# Patient Record
Sex: Female | Born: 1965 | Race: Black or African American | Hispanic: No | Marital: Married | Smoking: Never smoker
Health system: Southern US, Community
[De-identification: ages and names within clinical notes are randomized; demographics above are authoritative.]

## PROBLEM LIST (undated history)

## (undated) DIAGNOSIS — D649 Anemia, unspecified: Secondary | ICD-10-CM

## (undated) DIAGNOSIS — I10 Essential (primary) hypertension: Secondary | ICD-10-CM

## (undated) HISTORY — PX: BUNIONECTOMY: SHX129

## (undated) HISTORY — DX: Anemia, unspecified: D64.9

## (undated) HISTORY — PX: WISDOM TOOTH EXTRACTION: SHX21

## (undated) HISTORY — DX: Essential (primary) hypertension: I10

---

## 1996-10-18 HISTORY — PX: TUBAL LIGATION: SHX77

## 2008-10-18 HISTORY — PX: ENDOMETRIAL ABLATION: SHX621

## 2011-10-19 DIAGNOSIS — D649 Anemia, unspecified: Secondary | ICD-10-CM

## 2011-10-19 HISTORY — DX: Anemia, unspecified: D64.9

## 2014-03-01 ENCOUNTER — Ambulatory Visit (INDEPENDENT_AMBULATORY_CARE_PROVIDER_SITE_OTHER): Payer: 59 | Admitting: Gynecology

## 2014-03-01 ENCOUNTER — Other Ambulatory Visit: Payer: Self-pay

## 2014-03-01 ENCOUNTER — Other Ambulatory Visit: Payer: Self-pay | Admitting: Gynecology

## 2014-03-01 ENCOUNTER — Telehealth: Payer: Self-pay | Admitting: Gynecology

## 2014-03-01 ENCOUNTER — Encounter: Payer: Self-pay | Admitting: Gynecology

## 2014-03-01 VITALS — BP 132/96 | HR 98 | Resp 18 | Ht 62.5 in | Wt 161.0 lb

## 2014-03-01 DIAGNOSIS — Z01419 Encounter for gynecological examination (general) (routine) without abnormal findings: Secondary | ICD-10-CM

## 2014-03-01 DIAGNOSIS — D259 Leiomyoma of uterus, unspecified: Secondary | ICD-10-CM

## 2014-03-01 DIAGNOSIS — R21 Rash and other nonspecific skin eruption: Secondary | ICD-10-CM

## 2014-03-01 DIAGNOSIS — L988 Other specified disorders of the skin and subcutaneous tissue: Secondary | ICD-10-CM

## 2014-03-01 DIAGNOSIS — Z0000 Encounter for general adult medical examination without abnormal findings: Secondary | ICD-10-CM

## 2014-03-01 DIAGNOSIS — Z124 Encounter for screening for malignant neoplasm of cervix: Secondary | ICD-10-CM

## 2014-03-01 DIAGNOSIS — D219 Benign neoplasm of connective and other soft tissue, unspecified: Secondary | ICD-10-CM

## 2014-03-01 DIAGNOSIS — R238 Other skin changes: Secondary | ICD-10-CM

## 2014-03-01 DIAGNOSIS — Z1231 Encounter for screening mammogram for malignant neoplasm of breast: Secondary | ICD-10-CM

## 2014-03-01 LAB — POCT URINALYSIS DIPSTICK
Bilirubin, UA: NEGATIVE
Glucose, UA: NEGATIVE
KETONES UA: NEGATIVE
Leukocytes, UA: NEGATIVE
Nitrite, UA: NEGATIVE
PH UA: 6
PROTEIN UA: NEGATIVE
RBC UA: NEGATIVE
Urobilinogen, UA: NEGATIVE

## 2014-03-01 MED ORDER — NYSTATIN-TRIAMCINOLONE 100000-0.1 UNIT/GM-% EX OINT: 1.0000 "application " | TOPICAL_OINTMENT | Freq: Two times a day (BID) | CUTANEOUS | Status: DC

## 2014-03-01 MED ORDER — NYSTATIN 100000 UNIT/GM EX CREA: 1.0000 "application " | TOPICAL_CREAM | Freq: Two times a day (BID) | CUTANEOUS | Status: DC

## 2014-03-01 MED ORDER — TRIAMCINOLONE ACETONIDE 0.5 % EX OINT: 1.0000 "application " | TOPICAL_OINTMENT | Freq: Two times a day (BID) | CUTANEOUS | Status: DC

## 2014-03-01 NOTE — Telephone Encounter (Signed)
Patient saw Dr. Charlies Constable today and is calling to get a new RX because the nystatin/triamcinolone is not covered by her insurance. Please advise?

## 2014-03-01 NOTE — Telephone Encounter (Signed)
Left detailed message on patients voicemail at [phone]. Okay per release of information. Advised two new prescriptions sent over to pharmacy of choice. Patient will mix these two ointments together and apply two times a day. These two prescriptions should be covered by insurance and when mixed together they are the same as the original prescription. Advised to call back if she had any further questions.  Routing to provider for final review. Patient agreeable to disposition. Will close encounter

## 2014-03-01 NOTE — Addendum Note (Signed)
Addended by: Elroy Channel on: 03/01/2014 08:34 AM   Modules accepted: Orders

## 2014-03-01 NOTE — Patient Instructions (Signed)

## 2014-03-01 NOTE — Progress Notes (Signed)
48 y.o. Married [place] American female   [phone] here for annual exam. Pt is currently sexually active.  Pt has a history of vit D deficiency, was on supplementation in past.  Pt has had an ablation, she is no longer having cycles, but has has ovulatory symptoms, no hot flashes.  No abnormal PAP. No dyspareunia.    No LMP recorded. Patient is not currently having periods (Reason: Other).          Sexually active: yes  The current method of family planning is tubal ligation.    Exercising: yes  Walking Last pap: 01/2013 - Normal Alcohol: No Tobacco: No BSE: Yes, once a month MMG: 01/2013 - Normal    There are no preventive care reminders to display for this patient.  History reviewed. No pertinent family history.  There are no active problems to display for this patient.   Past Medical History  Diagnosis Date  . H/O vitamin D deficiency   . Abnormal uterine bleeding   . Anemia 2013  . High blood pressure   . Fibroid 2011    Past Surgical History  Procedure Laterality Date  . Tubal ligation  1998    Allergies: Review of patient's allergies indicates no known allergies.  No current outpatient prescriptions on file.   No current facility-administered medications for this visit.    ROS: Pertinent items are noted in HPI.  Exam:    BP 132/96  Pulse 98  Resp 18  Ht 5' 2.5" (1.588 m)  Wt 161 lb (73.029 kg)  BMI 28.96 kg/m2 Weight change: @WEIGHTCHANGE @ Last 3 height recordings:  Ht Readings from Last 3 Encounters:  03/01/14 5' 2.5" (1.588 m)   General appearance: alert, cooperative and appears stated age Head: Normocephalic, without obvious abnormality, atraumatic Neck: no adenopathy, no carotid bruit, no JVD, supple, symmetrical, trachea midline and thyroid not enlarged, symmetric, no tenderness/mass/nodules Lungs: clear to auscultation bilaterally Breasts: normal appearance, no masses or tenderness Heart: regular rate and rhythm, S1, S2 normal, no murmur, click,  rub or gallop Abdomen: soft, non-tender; bowel sounds normal; no masses,  no organomegaly Extremities: extremities normal, atraumatic, no cyanosis or edema Skin: Skin color, texture, turgor normal. No rashes or lesions, small rash between breasts Lymph nodes: Cervical, supraclavicular, and axillary nodes normal. no inguinal nodes palpated Neurologic: Grossly normal   Pelvic: External genitalia:  Shallow ulceration cluster posterior forchette with localized edema              Urethra: normal appearing urethra with no masses, tenderness or lesions              Bartholins and Skenes: Bartholin's, Urethra, Skene's normal                 Vagina: normal appearing vagina with normal color and discharge, no lesions              Cervix: normal appearance              Pap taken: yes        Bimanual Exam:  Uterus:  enlarged to 8 week's size, irregular                                      Adnexa:    no masses  Rectovaginal: Confirms                                      Anus:  normal sphincter tone, no lesions  A: well woman history of ablation Uterine fibroid Vesicular lesions Rash      P: mammogram pap smear with HRHPV, new guidelines reviewed Fibroids asymptomatic HSV titres mycolog counseled on breast self exam, mammography screening, adequate intake of calcium and vitamin D, diet and exercise return annually or prn   An After Visit Summary was printed and given to the patient.

## 2014-03-02 LAB — VITAMIN D 25 HYDROXY (VIT D DEFICIENCY, FRACTURES): Vit D, 25-Hydroxy: 28 ng/mL — ABNORMAL LOW (ref 30–89)

## 2014-03-04 ENCOUNTER — Other Ambulatory Visit: Payer: Self-pay | Admitting: *Deleted

## 2014-03-04 DIAGNOSIS — E559 Vitamin D deficiency, unspecified: Secondary | ICD-10-CM

## 2014-03-05 LAB — HSV(HERPES SMPLX)ABS-I+II(IGG+IGM)-BLD
HERPES SIMPLEX VRS I-IGM AB (EIA): 1.26 {index} — AB
HSV 1 Glycoprotein G Ab, IgG: 0.23 IV
HSV 2 GLYCOPROTEIN G AB, IGG: 6.72 IV — AB

## 2014-03-05 LAB — IPS PAP TEST WITH HPV

## 2014-03-07 MED ORDER — VALACYCLOVIR HCL 500 MG PO TABS: 500.0000 mg | ORAL_TABLET | Freq: Two times a day (BID) | ORAL | Status: DC

## 2014-03-07 NOTE — Progress Notes (Signed)
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  Pt reports having a blistered rash 10y ago that was tested by culture and to her recollection was negative.  She states that she has been married for 9y and her husband does not have herpes.  Reviewed the natural course of HSV infection and inaccuracy of culture.  We discussed that HSV can present as vaginitis around menses, she does not have a h/o recurrent vaginitis  As she has had very few outbreaks, i recommend she treat only as needed an rx was sent to pharmacy, questions addressed

## 2014-03-19 ENCOUNTER — Ambulatory Visit
Admission: RE | Admit: 2014-03-19 | Discharge: 2014-03-19 | Disposition: A | Discharge status: Home / Self Care | Payer: 59 | Source: Ambulatory Visit

## 2014-03-19 DIAGNOSIS — Z1231 Encounter for screening mammogram for malignant neoplasm of breast: Secondary | ICD-10-CM

## 2014-06-12 ENCOUNTER — Encounter: Payer: Self-pay | Admitting: Gynecology

## 2014-06-13 ENCOUNTER — Telehealth: Payer: Self-pay | Admitting: Gynecology

## 2014-06-13 NOTE — Telephone Encounter (Signed)
Patient canceled her appointment 06/18/14 for vitamin D labs. Patient says she will call later to reschedule.

## 2014-06-14 ENCOUNTER — Ambulatory Visit (INDEPENDENT_AMBULATORY_CARE_PROVIDER_SITE_OTHER): Payer: 59 | Admitting: Internal Medicine

## 2014-06-14 ENCOUNTER — Encounter: Payer: Self-pay | Admitting: Internal Medicine

## 2014-06-14 ENCOUNTER — Encounter (INDEPENDENT_AMBULATORY_CARE_PROVIDER_SITE_OTHER): Payer: Self-pay

## 2014-06-14 VITALS — Ht 62.25 in | Wt 161.0 lb

## 2014-06-14 DIAGNOSIS — Z91018 Allergy to other foods: Secondary | ICD-10-CM

## 2014-06-14 NOTE — Progress Notes (Signed)
Pre visit review using our clinic review tool, if applicable. No additional management support is needed unless otherwise documented below in the visit note. 

## 2014-06-14 NOTE — Progress Notes (Signed)
HPI  Pt presents to the clinic today to establish care. She reports that she does not really want to establish care but that she just wants to be allergy tested. She reports that she feels like she has food allergies. She can not pinpoint what it may be. She would like an order for an allergy panel to be drawn at [facility]. She prefers not to have skin testing done. She wants blood test. She can not tell me what specific symptoms she is having.  Was unable to ask any health maintenance information because patient was on the phone. I had to ask her twice to get off the phone so that we could discuss her health information.  Past Medical History  Diagnosis Date  . H/O vitamin D deficiency   . Abnormal uterine bleeding   . Anemia 2013  . High blood pressure   . Fibroid 2011    Current Outpatient Prescriptions  Medication Sig Dispense Refill  . nystatin cream (MYCOSTATIN) Apply 1 application topically 2 (two) times daily.  30 g  0  . triamcinolone ointment (KENALOG) 0.5 % Apply 1 application topically 2 (two) times daily.  30 g  0  . valACYclovir (VALTREX) 500 MG tablet Take 1 tablet (500 mg total) by mouth 2 (two) times daily. Take one tablet BID at onset of symptoms for 3 days.  12 tablet  1   No current facility-administered medications for this visit.    No Known Allergies  Family History  Problem Relation Age of Onset  . Cancer Neg Hx   . Diabetes Neg Hx   . Heart disease Neg Hx   . Stroke Neg Hx     History   Social History  . Marital Status: Married    Spouse Name: N/A    Number of Children: N/A  . Years of Education: N/A   Occupational History  . Not on file.   Social History Main Topics  . Smoking status: Never Smoker   . Smokeless tobacco: Never Used  . Alcohol Use: No  . Drug Use: No  . Sexual Activity: Yes    Birth Control/ Protection: Surgical   Other Topics Concern  . Not on file   Social History Narrative  . No narrative on file     ROS:  Constitutional: Denies fever, malaise, fatigue, headache or abrupt weight changes.  Respiratory: Denies difficulty breathing, shortness of breath, cough or sputum production.   Cardiovascular: Denies chest pain, chest tightness, palpitations or swelling in the hands or feet.  Gastrointestinal: Pt reports nausea. Denies abdominal pain, bloating, constipation, diarrhea or blood in the stool.  G Skin: Denies redness, rashes, lesions or ulcercations.    No other specific complaints in a complete review of systems (except as listed in HPI above).  PE:  Ht 5' 2.25" (1.581 m)  Wt 161 lb (73.029 kg)  BMI 29.22 kg/m2  LMP 03/18/2014 Wt Readings from Last 3 Encounters:  06/14/14 161 lb (73.029 kg)  03/01/14 161 lb (73.029 kg)    General: Appears her stated age, well developed, well nourished in NAD. Cardiovascular: Normal rate and rhythm. S1,S2 noted.  No murmur, rubs or gallops noted. No JVD or BLE edema. No carotid bruits noted. Pulmonary/Chest: Normal effort and positive vesicular breath sounds. No respiratory distress. No wheezes, rales or ronchi noted.    Assessment and Plan:  Concern of food allergies:  Will refer to Allergy for further testing Advised her I do not typically do allergy test  in the office She would not like to see me for anything else  RTC as needed

## 2014-06-14 NOTE — Patient Instructions (Signed)
Food Allergy A food allergy causes your body to have a strange reaction after you eat or drink certain foods or drinks. Allergic reactions can cause puffiness (swelling) and itchy, red rashes and hives. Sometimes you will throw up (vomit) or have watery poop (diarrhea). Severe allergic reactions can be life-threatening. These reactions can make it hard to breathe or swallow. HOME CARE If you do not know what caused your allergic reaction:  Write down the foods and drinks you had before the reaction.  Write down any problems you had.  Stop eating or drinking things that cause you to have a reaction. If you have hives or a rash:  Take medicine as told by your doctor.  Place cold cloths on your skin.  Take baths in cool water.  Do not take hot baths or showers. If you are severely allergic:  Wear a medical bracelet or necklace that lists your allergy.  Carry your allergy kit or medicine shot to treat severe allergic reactions with you. These can save your life.  Carry backup medicine shots. You can have a delayed reaction after the medicine from your first shot wears off. This can be just as serious as the first reaction.  Do not drive until medicine from your shot has worn off, unless your doctor says it is okay. GET HELP RIGHT AWAY IF:   You have trouble breathing or you are wheezing.  You have a tight feeling in your chest or throat.  You have puffiness around your mouth.  You have hives, puffiness, or itching all over your body.  You think you are having an allergic reaction. Problems usually start within 30 minutes after eating a food you are allergic to.  Your problems are not better after 2 days.  You have new problems.  Your problems come back. MAKE SURE YOU:   Understand these instructions.  Will watch your condition.  Will get help right away if you are not doing well or get worse. Document Released: 03/24/2010 Document Revised: 12/27/2011 Document Reviewed:  03/24/2010 [facility] Patient Information 2015 [place], [place]. This information is not intended to replace advice given to you by your health care provider. Make sure you discuss any questions you have with your health care provider.

## 2014-06-18 ENCOUNTER — Other Ambulatory Visit: Payer: Self-pay

## 2014-06-20 ENCOUNTER — Ambulatory Visit (INDEPENDENT_AMBULATORY_CARE_PROVIDER_SITE_OTHER): Payer: 59 | Admitting: Internal Medicine

## 2014-06-20 ENCOUNTER — Encounter: Payer: Self-pay | Admitting: Internal Medicine

## 2014-06-20 VITALS — BP 132/90 | HR 55 | Temp 98.4°F | Ht 63.0 in | Wt 160.5 lb

## 2014-06-20 DIAGNOSIS — R030 Elevated blood-pressure reading, without diagnosis of hypertension: Secondary | ICD-10-CM

## 2014-06-20 NOTE — Progress Notes (Signed)
Pre visit review using our clinic review tool, if applicable. No additional management support is needed unless otherwise documented below in the visit note. 

## 2014-06-20 NOTE — Patient Instructions (Addendum)

## 2014-06-21 ENCOUNTER — Encounter: Payer: Self-pay | Admitting: Internal Medicine

## 2014-06-21 DIAGNOSIS — R030 Elevated blood-pressure reading, without diagnosis of hypertension: Secondary | ICD-10-CM | POA: Insufficient documentation

## 2014-06-21 LAB — COMPREHENSIVE METABOLIC PANEL
ALBUMIN: 3.9 g/dL (ref 3.5–5.2)
ALT: 22 U/L (ref 0–35)
AST: 25 U/L (ref 0–37)
Alkaline Phosphatase: 70 U/L (ref 39–117)
BUN: 14 mg/dL (ref 6–23)
CALCIUM: 8.9 mg/dL (ref 8.4–10.5)
CHLORIDE: 106 meq/L (ref 96–112)
CO2: 28 mEq/L (ref 19–32)
Creatinine, Ser: 0.7 mg/dL (ref 0.4–1.2)
GFR: 112.65 mL/min (ref >60.00)
GLUCOSE: 79 mg/dL (ref 70–99)
POTASSIUM: 4.6 meq/L (ref 3.5–5.1)
Sodium: 139 mEq/L (ref 135–145)
Total Bilirubin: 0.9 mg/dL (ref 0.2–1.2)
Total Protein: 7.1 g/dL (ref 6.0–8.3)

## 2014-06-21 LAB — CBC
HCT: 37.3 % (ref 36.0–46.0)
Hemoglobin: 11.7 g/dL — ABNORMAL LOW (ref 12.0–15.0)
MCHC: 31.3 g/dL (ref 30.0–36.0)
MCV: 76.5 fl — ABNORMAL LOW (ref 78.0–100.0)
PLATELETS: 186 10*3/uL (ref 150.0–400.0)
RBC: 4.88 Mil/uL (ref 3.87–5.11)
RDW: 15 % (ref 11.5–15.5)
WBC: 7.1 10*3/uL (ref 4.0–10.5)

## 2014-06-21 NOTE — Assessment & Plan Note (Signed)
Advised her to work on diet and exercise Consume a low sodium diet She declines low dose medication at this time  I would like her to return in 1 month to recheck BP

## 2014-06-21 NOTE — Progress Notes (Signed)
Subjective:    Patient ID: Angel Black, female    DOB: 01-04-1966, 48 y.o.   MRN: [id number]  HPI  Pt presents to the clinic today with c/o headache and dizziness. She reports that she thought it may have been from food allergies. She was referred to an allergist. The allergist told her that it was not an allergy problem but that her blood pressure was elevated. Her BP at the allergist was 140/100. She was advised to followup with her PCP. Today, her BP is 130/90. She did have some headache and dizziness this morning but it has since subsided. She has been told in the past that she elevated blood pressure. At one time she was prescribed medication but reports that she never did take it.  Review of Systems      Past Medical History  Diagnosis Date  . H/O vitamin D deficiency   . Abnormal uterine bleeding   . Anemia 2013  . High blood pressure   . Fibroid 2011    Current Outpatient Prescriptions  Medication Sig Dispense Refill  . nystatin cream (MYCOSTATIN) Apply 1 application topically 2 (two) times daily.  30 g  0  . triamcinolone ointment (KENALOG) 0.5 % Apply 1 application topically 2 (two) times daily.  30 g  0  . valACYclovir (VALTREX) 500 MG tablet Take 1 tablet (500 mg total) by mouth 2 (two) times daily. Take one tablet BID at onset of symptoms for 3 days.  12 tablet  1   No current facility-administered medications for this visit.    No Known Allergies  Family History  Problem Relation Age of Onset  . Cancer Neg Hx   . Diabetes Neg Hx   . Heart disease Neg Hx   . Stroke Neg Hx     History   Social History  . Marital Status: Married    Spouse Name: N/A    Number of Children: N/A  . Years of Education: N/A   Occupational History  . Not on file.   Social History Main Topics  . Smoking status: Never Smoker   . Smokeless tobacco: Never Used  . Alcohol Use: No  . Drug Use: No  . Sexual Activity: Yes    Birth Control/ Protection: Surgical   Other Topics  Concern  . Not on file   Social History Narrative  . No narrative on file     Constitutional: Pt reports headache and dizziness. Denies fever, malaise, fatigue or abrupt weight changes.  Respiratory: Denies difficulty breathing, shortness of breath, cough or sputum production.   Cardiovascular: Denies chest pain, chest tightness, palpitations or swelling in the hands or feet.  Neurological: Denies difficulty with memory, difficulty with speech or problems with balance and coordination.   No other specific complaints in a complete review of systems (except as listed in HPI above).  Objective:   Physical Exam  BP 132/90  Pulse 55  Temp(Src) 98.4 F (36.9 C) (Oral)  Ht 5\' 3"  (1.6 m)  Wt 160 lb 8 oz (72.802 kg)  BMI 28.44 kg/m2  SpO2 98%  LMP 03/18/2014 Wt Readings from Last 3 Encounters:  06/20/14 160 lb 8 oz (72.802 kg)  06/14/14 161 lb (73.029 kg)  03/01/14 161 lb (73.029 kg)    General: Appears her stated age, well developed, well nourished in NAD. Cardiovascular: Normal rate and rhythm. S1,S2 noted.  No murmur, rubs or gallops noted. No JVD or BLE edema. No carotid bruits noted. Pulmonary/Chest: Normal effort and  positive vesicular breath sounds. No respiratory distress. No wheezes, rales or ronchi noted.  Neurological: Alert and oriented. Cranial nerves II-XII intact.   BMET    Component Value Date/Time   NA 139 06/20/2014 1617   K 4.6 06/20/2014 1617   CL 106 06/20/2014 1617   CO2 28 06/20/2014 1617   GLUCOSE 79 06/20/2014 1617   BUN 14 06/20/2014 1617   CREATININE 0.7 06/20/2014 1617   CALCIUM 8.9 06/20/2014 1617    Lipid Panel  No results found for this basename: chol, trig, hdl, cholhdl, vldl, ldlcalc    CBC    Component Value Date/Time   WBC 7.1 06/20/2014 1617   RBC 4.88 06/20/2014 1617   HGB 11.7* 06/20/2014 1617   HCT 37.3 06/20/2014 1617   PLT 186.0 06/20/2014 1617   MCV 76.5* 06/20/2014 1617   MCHC 31.3 06/20/2014 1617   RDW 15.0 06/20/2014 1617    Hgb A1C No results  found for this basename: HGBA1C         Assessment & Plan:

## 2014-07-25 ENCOUNTER — Ambulatory Visit: Payer: 59 | Admitting: Internal Medicine

## 2014-08-19 ENCOUNTER — Encounter: Payer: Self-pay | Admitting: Internal Medicine

## 2014-09-17 ENCOUNTER — Telehealth: Payer: Self-pay | Admitting: Gynecology

## 2014-09-17 NOTE — Telephone Encounter (Signed)
Left message regarding upcoming appointment has been canceled and needs to be rescheduled. °

## 2014-09-18 ENCOUNTER — Ambulatory Visit (INDEPENDENT_AMBULATORY_CARE_PROVIDER_SITE_OTHER): Payer: 59 | Admitting: Internal Medicine

## 2014-09-18 ENCOUNTER — Encounter: Payer: Self-pay | Admitting: Internal Medicine

## 2014-09-18 VITALS — BP 130/82 | HR 64 | Temp 98.1°F | Wt 163.0 lb

## 2014-09-18 DIAGNOSIS — R030 Elevated blood-pressure reading, without diagnosis of hypertension: Secondary | ICD-10-CM

## 2014-09-18 DIAGNOSIS — B9789 Other viral agents as the cause of diseases classified elsewhere: Principal | ICD-10-CM

## 2014-09-18 DIAGNOSIS — J069 Acute upper respiratory infection, unspecified: Secondary | ICD-10-CM

## 2014-09-18 MED ORDER — BENZONATATE 200 MG PO CAPS: 200.0000 mg | ORAL_CAPSULE | Freq: Two times a day (BID) | ORAL | Status: DC | PRN

## 2014-09-18 NOTE — Patient Instructions (Signed)
Upper Respiratory Infection, Adult An upper respiratory infection (URI) is also sometimes known as the common cold. The upper respiratory tract includes the nose, sinuses, throat, trachea, and bronchi. Bronchi are the airways leading to the lungs. Most people improve within 1 week, but symptoms can last up to 2 weeks. A residual cough may last even longer.  CAUSES Many different viruses can infect the tissues lining the upper respiratory tract. The tissues become irritated and inflamed and often become very moist. Mucus production is also common. A cold is contagious. You can easily spread the virus to others by oral contact. This includes kissing, sharing a glass, coughing, or sneezing. Touching your mouth or nose and then touching a surface, which is then touched by another person, can also spread the virus. SYMPTOMS  Symptoms typically develop 1 to 3 days after you come in contact with a cold virus. Symptoms vary from person to person. They may include:  Runny nose.  Sneezing.  Nasal congestion.  Sinus irritation.  Sore throat.  Loss of voice (laryngitis).  Cough.  Fatigue.  Muscle aches.  Loss of appetite.  Headache.  Low-grade fever. DIAGNOSIS  You might diagnose your own cold based on familiar symptoms, since most people get a cold 2 to 3 times a year. Your caregiver can confirm this based on your exam. Most importantly, your caregiver can check that your symptoms are not due to another disease such as strep throat, sinusitis, pneumonia, asthma, or epiglottitis. Blood tests, throat tests, and X-rays are not necessary to diagnose a common cold, but they may sometimes be helpful in excluding other more serious diseases. Your caregiver will decide if any further tests are required. RISKS AND COMPLICATIONS  You may be at risk for a more severe case of the common cold if you smoke cigarettes, have chronic heart disease (such as heart failure) or lung disease (such as asthma), or if  you have a weakened immune system. The very young and very old are also at risk for more serious infections. Bacterial sinusitis, middle ear infections, and bacterial pneumonia can complicate the common cold. The common cold can worsen asthma and chronic obstructive pulmonary disease (COPD). Sometimes, these complications can require emergency medical care and may be life-threatening. PREVENTION  The best way to protect against getting a cold is to practice good hygiene. Avoid oral or hand contact with people with cold symptoms. Wash your hands often if contact occurs. There is no clear evidence that vitamin C, vitamin E, echinacea, or exercise reduces the chance of developing a cold. However, it is always recommended to get plenty of rest and practice good nutrition. TREATMENT  Treatment is directed at relieving symptoms. There is no cure. Antibiotics are not effective, because the infection is caused by a virus, not by bacteria. Treatment may include:  Increased fluid intake. Sports drinks offer valuable electrolytes, sugars, and fluids.  Breathing heated mist or steam (vaporizer or shower).  Eating chicken soup or other clear broths, and maintaining good nutrition.  Getting plenty of rest.  Using gargles or lozenges for comfort.  Controlling fevers with ibuprofen or acetaminophen as directed by your caregiver.  Increasing usage of your inhaler if you have asthma. Zinc gel and zinc lozenges, taken in the first 24 hours of the common cold, can shorten the duration and lessen the severity of symptoms. Pain medicines may help with fever, muscle aches, and throat pain. A variety of non-prescription medicines are available to treat congestion and runny nose. Your caregiver   can make recommendations and may suggest nasal or lung inhalers for other symptoms.  HOME CARE INSTRUCTIONS   Only take over-the-counter or prescription medicines for pain, discomfort, or fever as directed by your  caregiver.  Use a warm mist humidifier or inhale steam from a shower to increase air moisture. This may keep secretions moist and make it easier to breathe.  Drink enough water and fluids to keep your urine clear or pale yellow.  Rest as needed.  Return to work when your temperature has returned to normal or as your caregiver advises. You may need to stay home longer to avoid infecting others. You can also use a face mask and careful hand washing to prevent spread of the virus. SEEK MEDICAL CARE IF:   After the first few days, you feel you are getting worse rather than better.  You need your caregiver's advice about medicines to control symptoms.  You develop chills, worsening shortness of breath, or brown or red sputum. These may be signs of pneumonia.  You develop yellow or brown nasal discharge or pain in the face, especially when you bend forward. These may be signs of sinusitis.  You develop a fever, swollen neck glands, pain with swallowing, or white areas in the back of your throat. These may be signs of strep throat. SEEK IMMEDIATE MEDICAL CARE IF:   You have a fever.  You develop severe or persistent headache, ear pain, sinus pain, or chest pain.  You develop wheezing, a prolonged cough, cough up blood, or have a change in your usual mucus (if you have chronic lung disease).  You develop sore muscles or a stiff neck. Document Released: 03/30/2001 Document Revised: 12/27/2011 Document Reviewed: 01/09/2014 [employer] Patient Information 2015 [employer]. This information is not intended to replace advice given to you by your health care provider. Make sure you discuss any questions you have with your health care provider.  

## 2014-09-18 NOTE — Progress Notes (Signed)
Pre visit review using our clinic review tool, if applicable. No additional management support is needed unless otherwise documented below in the visit note. 

## 2014-09-18 NOTE — Assessment & Plan Note (Signed)
BP good today No need for medication at this  time

## 2014-09-18 NOTE — Progress Notes (Signed)
Subjective:    Patient ID: Angel Black, female    DOB: [birth year], 48 y.o.   MRN: [id number]  HPI  Pt presents to the clinic today with c/o cough and shortness of breath. She reports this started yesterday. The cough is non productive. She has had some associated runny nose but she denies fever, chills or body aches. She has not tried anything OTC. She has no history of allergies or breathing problems. She has not tried anything OTC.  Additionally, she is also here to have her blood pressure rechecked. She has had 2 borderline high blood pressure readings-  132/90, 132/96 within the last 6 months. We had discussed putting her on medication for her blood pressure but she has been refusing medication up to this point. BP today is 130/82.  Review of Systems      Past Medical History  Diagnosis Date  . H/O vitamin D deficiency   . Abnormal uterine bleeding   . Anemia 2013  . High blood pressure   . Fibroid 2011    No current outpatient prescriptions on file.   No current facility-administered medications for this visit.    No Known Allergies  Family History  Problem Relation Age of Onset  . Cancer Neg Hx   . Diabetes Neg Hx   . Heart disease Neg Hx   . Stroke Neg Hx     History   Social History  . Marital Status: Married    Spouse Name: N/A    Number of Children: N/A  . Years of Education: N/A   Occupational History  . Not on file.   Social History Main Topics  . Smoking status: Never Smoker   . Smokeless tobacco: Never Used  . Alcohol Use: No  . Drug Use: No  . Sexual Activity: Yes    Birth Control/ Protection: Surgical   Other Topics Concern  . Not on file   Social History Narrative     Constitutional: Denies fever, malaise, fatigue, headache or abrupt weight changes.  HEENT: Pt reports runny nose. Denies eye pain, eye redness, ear pain, ringing in the ears, wax buildup, nasal congestion, bloody nose, or sore throat. Respiratory: Pt reports cough and  shortness of breath. Denies difficulty breathing or sputum production.   Cardiovascular: Denies chest pain, chest tightness, palpitations or swelling in the hands or feet.   No other specific complaints in a complete review of systems (except as listed in HPI above).  Objective:   Physical Exam   BP 130/82 mmHg  Pulse 64  Temp(Src) 98.1 F (36.7 C) (Oral)  Wt 163 lb (73.936 kg)  SpO2 99% Wt Readings from Last 3 Encounters:  09/18/14 163 lb (73.936 kg)  06/20/14 160 lb 8 oz (72.802 kg)  06/14/14 161 lb (73.029 kg)    General: Appears her stated age, well developed, well nourished in NAD. HEENT: Head: normal shape and size; Ears: Tm's gray and intact, normal light reflex; Nose: mucosa pink and moist, septum midline; Throat/Mouth: Teeth present, mucosa pink and moist, no exudate, lesions or ulcerations noted. Cervical adenopathy noted.  Cardiovascular: Normal rate and rhythm. S1,S2 noted.  No murmur, rubs or gallops noted.  Pulmonary/Chest: Normal effort and positive vesicular breath sounds. No respiratory distress. No wheezes, rales or ronchi noted.   BMET    Component Value Date/Time   NA 139 06/20/2014 1617   K 4.6 06/20/2014 1617   CL 106 06/20/2014 1617   CO2 28 06/20/2014 1617   GLUCOSE 79  06/20/2014 1617   BUN 14 06/20/2014 1617   CREATININE 0.7 06/20/2014 1617   CALCIUM 8.9 06/20/2014 1617    Lipid Panel  No results found for: CHOL, TRIG, HDL, CHOLHDL, VLDL, LDLCALC  CBC    Component Value Date/Time   WBC 7.1 06/20/2014 1617   RBC 4.88 06/20/2014 1617   HGB 11.7* 06/20/2014 1617   HCT 37.3 06/20/2014 1617   PLT 186.0 06/20/2014 1617   MCV 76.5* 06/20/2014 1617   MCHC 31.3 06/20/2014 1617   RDW 15.0 06/20/2014 1617    Hgb A1C No results found for: HGBA1C      Assessment & Plan:   Viral URI with cough:  Try some Ibuprofen OTC Get some rest and drink plenty of fluids eRx for Benzonate TID prn Watch for fever, chills, or colored mucous  RTC as  needed or if symptoms persist or worsen

## 2015-01-29 ENCOUNTER — Telehealth: Payer: Self-pay | Admitting: Certified Nurse Midwife

## 2015-01-29 NOTE — Telephone Encounter (Signed)
Pt requests date of last pap.

## 2015-01-29 NOTE — Telephone Encounter (Signed)
Return call to patient. She is calling to confirm date of last annual exam with Dr Charlies Constable. Advised last seen for annual 03-01-14 and scheduled for 03-20-15 with Debbi Hollice Espy.    Routing to provider for final review. Patient agreeable to disposition. Will close encounter.

## 2015-01-31 ENCOUNTER — Ambulatory Visit (INDEPENDENT_AMBULATORY_CARE_PROVIDER_SITE_OTHER): Payer: 59 | Admitting: Internal Medicine

## 2015-01-31 ENCOUNTER — Encounter: Payer: Self-pay | Admitting: Internal Medicine

## 2015-01-31 VITALS — BP 122/86 | HR 66 | Temp 98.1°F | Wt 160.0 lb

## 2015-01-31 DIAGNOSIS — N898 Other specified noninflammatory disorders of vagina: Secondary | ICD-10-CM | POA: Diagnosis not present

## 2015-01-31 DIAGNOSIS — R102 Pelvic and perineal pain: Secondary | ICD-10-CM

## 2015-01-31 DIAGNOSIS — D259 Leiomyoma of uterus, unspecified: Secondary | ICD-10-CM

## 2015-01-31 DIAGNOSIS — N939 Abnormal uterine and vaginal bleeding, unspecified: Secondary | ICD-10-CM

## 2015-01-31 LAB — POCT URINALYSIS DIPSTICK
Bilirubin, UA: NEGATIVE
Blood, UA: NEGATIVE
GLUCOSE UA: NEGATIVE
KETONES UA: NEGATIVE
LEUKOCYTES UA: NEGATIVE
Nitrite, UA: NEGATIVE
Spec Grav, UA: 1.025
UROBILINOGEN UA: NEGATIVE
pH, UA: 7

## 2015-01-31 NOTE — Progress Notes (Signed)
Subjective:    Patient ID: Angel Black, female    DOB: Feb 08, 1966, 49 y.o.   MRN: [id number]  HPI  Pt presents to the clinic today with c/o pelvic pain. This has been going on for 1-2 months. She reports it feels crampy and burns. She has had some vaginal spotting, which almost last as long as a period. She denies nausea, vomiting, constipation or blood in her stool. Her bowel movements are normal. She is very concerned about this vaginal bleeding because she had assumed she had gone through menopause because she did not have a period after her ablation. Her last pap smear was 02/2014. She has an appt with gyn in June.  Review of Systems  Past Medical History  Diagnosis Date  . H/O vitamin D deficiency   . Abnormal uterine bleeding   . Anemia 2013  . High blood pressure   . Fibroid 2011    Current Outpatient Prescriptions  Medication Sig Dispense Refill  . benzonatate (TESSALON) 200 MG capsule Take 1 capsule (200 mg total) by mouth 2 (two) times daily as needed for cough. 20 capsule 0   No current facility-administered medications for this visit.    No Known Allergies  Family History  Problem Relation Age of Onset  . Cancer Neg Hx   . Diabetes Neg Hx   . Heart disease Neg Hx   . Stroke Neg Hx     History   Social History  . Marital Status: Married    Spouse Name: N/A  . Number of Children: N/A  . Years of Education: N/A   Occupational History  . Not on file.   Social History Main Topics  . Smoking status: Never Smoker   . Smokeless tobacco: Never Used  . Alcohol Use: No  . Drug Use: No  . Sexual Activity: Yes    Birth Control/ Protection: Surgical   Other Topics Concern  . Not on file   Social History Narrative     Constitutional: Denies fever, malaise, fatigue, headache or abrupt weight changes.  Respiratory: Denies difficulty breathing, shortness of breath, cough or sputum production.   Cardiovascular: Denies chest pain, chest tightness, palpitations  or swelling in the hands or feet.  Gastrointestinal: Pt reports abdominal pain. Denies bloating, constipation, diarrhea or blood in the stool.  GU: Denies urgency, frequency, pain with urination, burning sensation, blood in urine, odor or discharge.   No other specific complaints in a complete review of systems (except as listed in HPI above).     Objective:   Physical Exam   BP 122/86 mmHg  Pulse 66  Temp(Src) 98.1 F (36.7 C) (Oral)  Wt 160 lb (72.576 kg)  SpO2 99% Wt Readings from Last 3 Encounters:  01/31/15 160 lb (72.576 kg)  09/18/14 163 lb (73.936 kg)  06/20/14 160 lb 8 oz (72.802 kg)    General: Appears her stated age, well developed, well nourished in NAD. Skin: Warm, dry and intact. Cardiovascular: Normal rate and rhythm. S1,S2 noted.  No murmur, rubs or gallops noted.  Pulmonary/Chest: Normal effort and positive vesicular breath sounds. No respiratory distress. No wheezes, rales or ronchi noted.  Abdomen: Soft and tender in the lower abdominal region. Normal bowel sounds, no bruits noted. No distention or masses noted.  Pelvic: Normal female anatomy. No CMT noted. Adnexa non palpable. No discharge noted.  BMET    Component Value Date/Time   NA 139 06/20/2014 1617   K 4.6 06/20/2014 1617   CL 106  06/20/2014 1617   CO2 28 06/20/2014 1617   GLUCOSE 79 06/20/2014 1617   BUN 14 06/20/2014 1617   CREATININE 0.7 06/20/2014 1617   CALCIUM 8.9 06/20/2014 1617    Lipid Panel  No results found for: CHOL, TRIG, HDL, CHOLHDL, VLDL, LDLCALC  CBC    Component Value Date/Time   WBC 7.1 06/20/2014 1617   RBC 4.88 06/20/2014 1617   HGB 11.7* 06/20/2014 1617   HCT 37.3 06/20/2014 1617   PLT 186.0 06/20/2014 1617   MCV 76.5* 06/20/2014 1617   MCHC 31.3 06/20/2014 1617   RDW 15.0 06/20/2014 1617    Hgb A1C No results found for: HGBA1C      Assessment & Plan:   Pelvic pain in female with vaginal spotting:  She does have a history of fibroids- could be the  cause of bleeding She thinks she has been through menopause because she has not been having vaginal bleeding in > 2 years (she did have an ablation) Will check FSH/LH today to check status of menopause Urinalysis normal Will obtain pelvic/transvaginal ultrasound Ibuprofen as needed for pain  Will follow up with you after labs and imaging are done

## 2015-01-31 NOTE — Patient Instructions (Signed)
Pelvic Pain Female pelvic pain can be caused by many different things and start from a variety of places. Pelvic pain refers to pain that is located in the lower half of the abdomen and between your hips. The pain may occur over a short period of time (acute) or may be reoccurring (chronic). The cause of pelvic pain may be related to disorders affecting the female reproductive organs (gynecologic), but it may also be related to the bladder, kidney stones, an intestinal complication, or muscle or skeletal problems. Getting help right away for pelvic pain is important, especially if there has been severe, sharp, or a sudden onset of unusual pain. It is also important to get help right away because some types of pelvic pain can be life threatening.  CAUSES  Below are only some of the causes of pelvic pain. The causes of pelvic pain can be in one of several categories.   Gynecologic.  Pelvic inflammatory disease.  Sexually transmitted infection.  Ovarian cyst or a twisted ovarian ligament (ovarian torsion).  Uterine lining that grows outside the uterus (endometriosis).  Fibroids, cysts, or tumors.  Ovulation.  Pregnancy.  Pregnancy that occurs outside the uterus (ectopic pregnancy).  Miscarriage.  Labor.  Abruption of the placenta or ruptured uterus.  Infection.  Uterine infection (endometritis).  Bladder infection.  Diverticulitis.  Miscarriage related to a uterine infection (septic abortion).  Bladder.  Inflammation of the bladder (cystitis).  Kidney stone(s).  Gastrointestinal.  Constipation.  Diverticulitis.  Neurologic.  Trauma.  Feeling pelvic pain because of mental or emotional causes (psychosomatic).  Cancers of the bowel or pelvis. EVALUATION  Your caregiver will want to take a careful history of your concerns. This includes recent changes in your health, a careful gynecologic history of your periods (menses), and a sexual history. Obtaining your family  history and medical history is also important. Your caregiver may suggest a pelvic exam. A pelvic exam will help identify the location and severity of the pain. It also helps in the evaluation of which organ system may be involved. In order to identify the cause of the pelvic pain and be properly treated, your caregiver may order tests. These tests may include:   A pregnancy test.  Pelvic ultrasonography.  An X-ray exam of the abdomen.  A urinalysis or evaluation of vaginal discharge.  Blood tests. HOME CARE INSTRUCTIONS   Only take over-the-counter or prescription medicines for pain, discomfort, or fever as directed by your caregiver.   Rest as directed by your caregiver.   Eat a balanced diet.   Drink enough fluids to make your urine clear or pale yellow, or as directed.   Avoid sexual intercourse if it causes pain.   Apply warm or cold compresses to the lower abdomen depending on which one helps the pain.   Avoid stressful situations.   Keep a journal of your pelvic pain. Write down when it started, where the pain is located, and if there are things that seem to be associated with the pain, such as food or your menstrual cycle.  Follow up with your caregiver as directed.  SEEK MEDICAL CARE IF:  Your medicine does not help your pain.  You have abnormal vaginal discharge. SEEK IMMEDIATE MEDICAL CARE IF:   You have heavy bleeding from the vagina.   Your pelvic pain increases.   You feel light-headed or faint.   You have chills.   You have pain with urination or blood in your urine.   You have uncontrolled diarrhea   or vomiting.   You have a fever or persistent symptoms for more than 3 days.  You have a fever and your symptoms suddenly get worse.   You are being physically or sexually abused.  MAKE SURE YOU:  Understand these instructions.  Will watch your condition.  Will get help if you are not doing well or get worse. Document Released:  08/31/2004 Document Revised: 02/18/2014 Document Reviewed: 01/24/2012 ExitCare Patient Information 2015 [employer]. This information is not intended to replace advice given to you by your health care provider. Make sure you discuss any questions you have with your health care provider.  

## 2015-01-31 NOTE — Progress Notes (Signed)
Pre visit review using our clinic review tool, if applicable. No additional management support is needed unless otherwise documented below in the visit note. 

## 2015-02-01 LAB — LUTEINIZING HORMONE: LH: 15.2 m[IU]/mL

## 2015-02-01 LAB — FOLLICLE STIMULATING HORMONE: FSH: 35.4 m[IU]/mL

## 2015-02-03 ENCOUNTER — Ambulatory Visit
Admission: RE | Admit: 2015-02-03 | Discharge: 2015-02-03 | Disposition: A | Discharge status: Home / Self Care | Payer: 59 | Source: Ambulatory Visit | Attending: Internal Medicine | Admitting: Internal Medicine

## 2015-02-03 DIAGNOSIS — N939 Abnormal uterine and vaginal bleeding, unspecified: Secondary | ICD-10-CM

## 2015-02-03 DIAGNOSIS — R102 Pelvic and perineal pain: Secondary | ICD-10-CM

## 2015-02-03 DIAGNOSIS — D259 Leiomyoma of uterus, unspecified: Secondary | ICD-10-CM

## 2015-02-05 ENCOUNTER — Telehealth: Payer: Self-pay | Admitting: Certified Nurse Midwife

## 2015-02-05 NOTE — Telephone Encounter (Signed)
I recommend a physician visit.  Patient has fibroids and may need an endometrial biopsy.  Needs evaluation first and some additional blood work.  I am happy to see her.  Thanks!

## 2015-02-05 NOTE — Telephone Encounter (Deleted)
LMTCB available appt with pg

## 2015-02-05 NOTE — Telephone Encounter (Signed)
Spoke with patient. Advised of message as seen below from Harvey. Patient is agreeable. Appointment scheduled for 4/25 at 11:45am with Dr.Silva. Patient is agreeable to date and time.  Routing to provider for final review. Patient agreeable to disposition. Will close encounter

## 2015-02-05 NOTE — Telephone Encounter (Signed)
Patient was seen with primary care provider Webb Silversmith, NP on 02/03/2015 for pelvic pain and vaginal spotting. Patient had [place] and LH drawn. FSH was 35.4. LH was 15.2. Patient had PUS performed on 02/03/2015 which showed results seen below. Patient was advised to seek follow up with GYN. Has aex scheduled for 03/20/2015 with Regina Eck CNM. Please review notes and results in EPIC from patients OV with PCP for further advise regarding follow up.  FINDINGS: Uterus  Measurements: 9.2 x 6.5 x 7.9 cm. There are multiple fibroids. On the right the largest measures 3.9 cm in greatest dimension. On the left the largest measures 2.9 cm.  Endometrium  Thickness: 5.5 mm. No focal abnormality visualized.  Right ovary  The right ovary could not be visualized  Left ovary  The left ovary could not be visualized  Other findings  No free fluid.  IMPRESSION: 1. There are multiple uterine fibroids with the largest measuring 3.9 cm. 2. The endometrial stripe is normal at 5.5 mm. In the setting of post-menopausal bleeding, endometrial sampling is indicated to exclude carcinoma. If results are benign, sonohysterogram should be considered for focal lesion work-up. (Ref: Radiological Reasoning: Algorithmic Workup of Abnormal Vaginal Bleeding with Endovaginal Sonography and Sonohysterography. AJR 2008; 921:J94-17) 3. The ovaries could not be demonstrated. No discrete adnexal masses were demonstrated either.   Electronically Signed  By: David Martinique  On: 02/03/2015 17:00

## 2015-02-05 NOTE — Telephone Encounter (Signed)
Patient was seen by her primary physician and had a pelvic ultrasound done. She states she was told to see a gyn asap. She also said they would be faxing her results here.

## 2015-02-10 ENCOUNTER — Encounter: Payer: Self-pay | Admitting: Obstetrics and Gynecology

## 2015-02-10 ENCOUNTER — Ambulatory Visit (INDEPENDENT_AMBULATORY_CARE_PROVIDER_SITE_OTHER): Payer: 59 | Admitting: Obstetrics and Gynecology

## 2015-02-10 DIAGNOSIS — D259 Leiomyoma of uterus, unspecified: Secondary | ICD-10-CM | POA: Diagnosis not present

## 2015-02-10 LAB — CBC
HEMATOCRIT: 39.7 % (ref 36.0–46.0)
HEMOGLOBIN: 12.4 g/dL (ref 12.0–15.0)
MCH: 23.8 pg — AB (ref 26.0–34.0)
MCHC: 31.2 g/dL (ref 30.0–36.0)
MCV: 76.2 fL — ABNORMAL LOW (ref 78.0–100.0)
MPV: 11.4 fL (ref 8.6–12.4)
PLATELETS: 191 10*3/uL (ref 150–400)
RBC: 5.21 MIL/uL — AB (ref 3.87–5.11)
RDW: 14.9 % (ref 11.5–15.5)
WBC: 7 10*3/uL (ref 4.0–10.5)

## 2015-02-10 NOTE — Patient Instructions (Addendum)
We will call you with insurance information about proceeding forward with hysterectomy!

## 2015-02-10 NOTE — Progress Notes (Signed)
GYNECOLOGY  VISIT   HPI: 49 y.o.   Married  [place]  female   [phone] with Patient's last menstrual period was 10/18/2008.   here for discussion of fibroids and bleeding.  Sent by her PCP.   Spotting vaginally in March for 2 days.  Had pain also.  Pain is fairly consistent off and on every few days.  Some lower abdominal pressure and left lower back pain.  Some dizziness.   History of prior ablation in 2010 by [facility] in [place].  Thinks it may have been a Novasure.   Believes she had fibroids in the past.   Status post PPTL in 1998.  2 vaginal deliveries.   FSH - 35.4 and LH 15.2 on 01/31/15.   CLINICAL DATA: Lower pelvic pain, generally postprandially, for the past 2 months. , history of fibroids, intermittent spotting.  EXAM: TRANSABDOMINAL AND TRANSVAGINAL ULTRASOUND OF PELVIS  TECHNIQUE: Both transabdominal and transvaginal ultrasound examinations of the pelvis were performed. Transabdominal technique was performed for global imaging of the pelvis including uterus, ovaries, adnexal regions, and pelvic cul-de-sac. It was necessary to proceed with endovaginal exam following the transabdominal exam to visualize the endometrium and adnexal structures.  COMPARISON: None  FINDINGS: Uterus  Measurements: 9.2 x 6.5 x 7.9 cm. There are multiple fibroids. On the right the largest measures 3.9 cm in greatest dimension. On the left the largest measures 2.9 cm.  Endometrium  Thickness: 5.5 mm. No focal abnormality visualized.  Right ovary  The right ovary could not be visualized  Left ovary  The left ovary could not be visualized  Other findings  No free fluid.  IMPRESSION: 1. There are multiple uterine fibroids with the largest measuring 3.9 cm. 2. The endometrial stripe is normal at 5.5 mm. In the setting of post-menopausal bleeding, endometrial sampling is indicated to exclude carcinoma. If results are benign, sonohysterogram  should be considered for focal lesion work-up. (Ref: Radiological Reasoning: Algorithmic Workup of Abnormal Vaginal Bleeding with Endovaginal Sonography and Sonohysterography. AJR 2008; 300:P23-30) 3. The ovaries could not be demonstrated. No discrete adnexal masses were demonstrated either.   Electronically Signed  By: David Martinique  On: 02/03/2015 17:00     GYNECOLOGIC HISTORY: Patient's last menstrual period was 10/18/2008. Contraception:  Tubal ligation/ endometrial ablation Menopausal hormone therapy: None        OB History    Gravida Para Term Preterm AB TAB SAB Ectopic Multiple Living   2 2 2       2          Patient Active Problem List   Diagnosis Date Noted  . Elevated blood pressure (not hypertension) 06/21/2014  . Fibroid     Past Medical History  Diagnosis Date  . H/O vitamin D deficiency   . Abnormal uterine bleeding   . Anemia 2013  . High blood pressure   . Fibroid 2011    Past Surgical History  Procedure Laterality Date  . Tubal ligation  1998  . Endometrial ablation  2010  . Bunionectomy Bilateral 2000, 1996    No current outpatient prescriptions on file.   No current facility-administered medications for this visit.     ALLERGIES: Review of patient's allergies indicates no known allergies.  Family History  Problem Relation Age of Onset  . Cancer Neg Hx   . Diabetes Neg Hx   . Heart disease Neg Hx   . Stroke Neg Hx     History   Social History  . Marital Status:  Married    Spouse Name: N/A  . Number of Children: N/A  . Years of Education: N/A   Occupational History  . Not on file.   Social History Main Topics  . Smoking status: Never Smoker   . Smokeless tobacco: Never Used  . Alcohol Use: No  . Drug Use: No  . Sexual Activity: Yes    Birth Control/ Protection: Surgical   Other Topics Concern  . Not on file   Social History Narrative    ROS:  Pertinent items are noted in HPI.  PHYSICAL EXAMINATION:    BP  116/70 mmHg  Pulse 66  Ht 5\' 3"  (1.6 m)  Wt 158 lb 6.4 oz (71.85 kg)  BMI 28.07 kg/m2  LMP 10/18/2008     General appearance: alert, cooperative and appears stated age Lungs: clear to auscultation bilaterally Heart: regular rate and rhythm Abdomen: soft, non-tender;  Mass to umbilicus - 4 cm, nontender.  No abnormal inguinal nodes palpated  Pelvic: External genitalia:  no lesions              Urethra:  normal appearing urethra with no masses, tenderness or lesions              Bartholins and Skenes: normal                 Vagina: normal appearing vagina with normal color and discharge, no lesions              Cervix: normal appearance                   Bimanual Exam:  Uterus:  uterus is 15 - 16 week size, shape, consistency and nontender                                      Adnexa: normal adnexa in size, nontender and no masses                                      Rectovaginal:  Yes.                                                                Confirms                                      Anus:  normal sphincter tone, no lesions  ASSESSMENT  Multifibroid uterus.  Status post endometrial ablation.  Status post bilateral tubal ligation. History of hypertension.   PLAN  Discussion of fibroids in general.  Discussed options for care including medical and surgical management of uterine fibroids - contraceptive methods - Depo Provera; uterine artery embolization, and hysterectomy.  Focused discussion on hysterectomy including a laparoscopic robotic approach versus an abdominal hysterectomy, each with bilateral salpingectomy and cystoscopy.  Risks and benefits of these approaches reviewed.  ACOG handout on hysterectomy and DaVinci Robotic DVD to patient.  Discussion of morcellation and risk of spread malignancy and worsening or prognosis if a hidden sarcoma were present.  Surgical expectations  and recovery discussed.  Patient wishes to proceed with a total abdominal hysterectomy  approach.  Will precert.    An After Visit Summary was printed and given to the patient.  __25__ minutes face to face time of which over 50% was spent in counseling.

## 2015-02-13 ENCOUNTER — Telehealth: Payer: Self-pay | Admitting: *Deleted

## 2015-02-13 NOTE — Telephone Encounter (Signed)
Call to patient to discuss potential surgery dates. Avaialble dates discussed and patient is ready to proceed. Will schedule and call her back to confirm.

## 2015-02-13 NOTE — Telephone Encounter (Signed)
Call to patient. Per Angel Black, patient has questions regarding surgery benefit quote.  I reviewed the benefit quote received for the surgeons portion of her surgery. Patient agreeable. Advised patient that she will need to contact the [place] at [phone] for information related to the facility charges. Also provided patient with the following CPT codes: 58150/52000. Patient agreeable.

## 2015-02-14 NOTE — Telephone Encounter (Signed)
Call to patient. Advised surgery is scheduled for 03-03-15 at 67 at [facility]. Surgery instruction sheet reviewed and printed copy mailed, see scanned copy.   Routing to provider for final review. Patient agreeable to disposition. Will close encounter

## 2015-02-19 ENCOUNTER — Ambulatory Visit (INDEPENDENT_AMBULATORY_CARE_PROVIDER_SITE_OTHER): Payer: 59 | Admitting: Obstetrics and Gynecology

## 2015-02-19 ENCOUNTER — Encounter: Payer: Self-pay | Admitting: Obstetrics and Gynecology

## 2015-02-19 ENCOUNTER — Telehealth: Payer: Self-pay | Admitting: Obstetrics and Gynecology

## 2015-02-19 VITALS — BP 140/88 | HR 60 | Ht 63.0 in | Wt 150.8 lb

## 2015-02-19 DIAGNOSIS — D259 Leiomyoma of uterus, unspecified: Secondary | ICD-10-CM

## 2015-02-19 NOTE — Progress Notes (Signed)
Patient ID: Angel Black, female   DOB: 04/01/1966, 49 y.o.   MRN: [id number] GYNECOLOGY  VISIT   HPI: 49 y.o.   Married  [place] American  female   [phone] with Patient's last menstrual period was 10/18/2008.   here to discuss upcoming surgery.  Desires hysterectomy for uterine fibroids.   Ultrasound on 02/03/15 - Multiple fibroids seen, largest 3.9 cm.  Adnexa unremarkable but ovaries not well seen.  EMS 5.5 mm.  No free fluid.  Status post ablation of endometrium.  Status post tubal ligation.  Declines future childbearing.   Patient is having pain episodes.  In March, pain started.  Not necessarily associated with bleeding.  Also does have vaginal bleeding.  No prior diagnosis of endometriosis.   Works for [employer].   GYNECOLOGIC HISTORY: Patient's last menstrual period was 10/18/2008. Contraception:  Tubal ligation Menopausal hormone therapy: n/a Last pap:  03-01-14 wnl:neg HR HPV Last mammogram:  03-19-14 fibroglandular density/nl:[facility]        OB History    Gravida Para Term Preterm AB TAB SAB Ectopic Multiple Living   2 2 2       2          Patient Active Problem List   Diagnosis Date Noted  . Elevated blood pressure (not hypertension) 06/21/2014  . Fibroid     Past Medical History  Diagnosis Date  . H/O vitamin D deficiency   . Abnormal uterine bleeding   . Anemia 2013  . High blood pressure   . Fibroid 2011    Past Surgical History  Procedure Laterality Date  . Tubal ligation  1998  . Endometrial ablation  2010  . Bunionectomy Bilateral 2000, 1996    No current outpatient prescriptions on file.   No current facility-administered medications for this visit.     ALLERGIES: Review of patient's allergies indicates no known allergies.  Family History  Problem Relation Age of Onset  . Cancer Neg Hx   . Diabetes Neg Hx   . Heart disease Neg Hx   . Stroke Neg Hx     History   Social History  . Marital Status: Married   Spouse Name: N/A  . Number of Children: N/A  . Years of Education: N/A   Occupational History  . Not on file.   Social History Main Topics  . Smoking status: Never Smoker   . Smokeless tobacco: Never Used  . Alcohol Use: No  . Drug Use: No  . Sexual Activity: Yes    Birth Control/ Protection: Surgical     Comment: Tubal   Other Topics Concern  . Not on file   Social History Narrative    ROS:  Pertinent items are noted in HPI.  PHYSICAL EXAMINATION:    BP 140/88 mmHg  Pulse 60  Ht 5\' 3"  (1.6 m)  Wt 150 lb 12.8 oz (68.402 kg)  BMI 26.72 kg/m2  LMP 10/18/2008     General appearance: alert, cooperative and appears stated age Lungs: clear to auscultation bilaterally Heart: regular rate and rhythm Abdomen: soft, non-tender; 16 week size midline mass which is nontender. No abnormal inguinal nodes palpated  Pelvic: External genitalia:  no lesions              Urethra:  normal appearing urethra with no masses, tenderness or lesions              Bartholins and Skenes: normal  Vagina: normal appearing vagina with normal color and discharge, no lesions              Cervix: normal appearance                   Bimanual Exam:  Uterus:  uterus is  15 - 16 week size and  nontender                                      Adnexa: normal adnexa in size, nontender and no masses                                     ASSESSMENT  Uterine fibroids.  Status post BTL.  Status post endometrial ablation.   PLAN  Discussed total abdominal hysterectomy with bilateral salpingectomy, possible bilateral oophorectomy.  Risks, benefits, and alternatives discussed with the patient who wishes to proceed.  Risks include but are not limited to bleeding, infection, damage to surrounding organs, reaction to anesthesia, pneumonia, DVT, PE, death, hernia formation, neuropathy, need for reoperation, surgical menopause if ovaries are removed and need for hormonal therapy, and continued pain  after surgical recovery. Surgical expectations and recovery discussed.  Will remove ovaries if diseased.   Magnesium citrate bowel prep discussed.   An After Visit Summary was printed and given to the patient.  ___25___ minutes face to face time of which over 50% was spent in counseling.

## 2015-02-19 NOTE — Telephone Encounter (Signed)
Patient is asking for a letter with her surgery date and post operative period. Patient would like to pick up next Wednesday 02/26/15.

## 2015-02-20 NOTE — Telephone Encounter (Signed)
Please do write a letter for the patient for 6 weeks out of work.  Thank you!

## 2015-02-20 NOTE — Telephone Encounter (Signed)
Dr. Quincy Simmonds,  Okay to write letter for patient? She is scheduled for TAH on 03/03/15.  Return to work in 6 weeks would be 04/14/15.  Patient states she needs this letter in order to start process for FMLA.

## 2015-02-21 ENCOUNTER — Encounter: Payer: Self-pay | Admitting: Emergency Medicine

## 2015-02-21 NOTE — Telephone Encounter (Signed)
Letter printed and to Dr. Silva.  

## 2015-02-21 NOTE — Telephone Encounter (Signed)
Letter in front desk for patient. Patient notified. Routing to provider for final review. Patient agreeable to disposition.   Will close encounter.

## 2015-02-24 DIAGNOSIS — Z0289 Encounter for other administrative examinations: Secondary | ICD-10-CM

## 2015-02-24 NOTE — Patient Instructions (Addendum)
   Your procedure is scheduled on:  Monday, May 16  Enter through the Main Entrance of [facility] at: 6 AM Pick up the phone at the desk and dial [phone] and inform us of your arrival.  Please call this number if you have any problems the morning of surgery: [phone]  Remember: Do not eat or drink after midnight: Sunday Take these medicines the morning of surgery with a SIP OF WATER:  None  Do not wear jewelry, make-up, or FINGER nail polish No metal in your hair or on your body. Do not wear lotions, powders, perfumes.  You may wear deodorant.  Do not bring valuables to the hospital. Contacts, dentures or bridgework may not be worn into surgery.  Leave suitcase in the car. After Surgery it may be brought to your room. For patients being admitted to the hospital, checkout time is 11:00am the day of discharge.  [place] [facility] [phone].

## 2015-02-25 ENCOUNTER — Encounter (HOSPITAL_COMMUNITY): Payer: Self-pay

## 2015-02-25 ENCOUNTER — Encounter (HOSPITAL_COMMUNITY)
Admission: RE | Admit: 2015-02-25 | Discharge: 2015-02-25 | Disposition: A | Discharge status: Home / Self Care | Payer: 59 | Source: Ambulatory Visit | Attending: Obstetrics and Gynecology | Admitting: Obstetrics and Gynecology

## 2015-02-25 ENCOUNTER — Other Ambulatory Visit: Payer: Self-pay

## 2015-02-25 DIAGNOSIS — Z01818 Encounter for other preprocedural examination: Secondary | ICD-10-CM | POA: Insufficient documentation

## 2015-02-25 DIAGNOSIS — Z01810 Encounter for preprocedural cardiovascular examination: Secondary | ICD-10-CM | POA: Insufficient documentation

## 2015-02-25 LAB — BASIC METABOLIC PANEL
Anion gap: 4 — ABNORMAL LOW (ref 5–15)
BUN: 13 mg/dL (ref 6–20)
CO2: 28 mmol/L (ref 22–32)
Calcium: 9 mg/dL (ref 8.9–10.3)
Chloride: 108 mmol/L (ref 101–111)
Creatinine, Ser: 0.66 mg/dL (ref 0.44–1.00)
Glucose, Bld: 85 mg/dL (ref 70–99)
POTASSIUM: 4.1 mmol/L (ref 3.5–5.1)
Sodium: 140 mmol/L (ref 135–145)

## 2015-02-25 LAB — CBC
HEMATOCRIT: 36.4 % (ref 36.0–46.0)
Hemoglobin: 11.6 g/dL — ABNORMAL LOW (ref 12.0–15.0)
MCH: 23.7 pg — ABNORMAL LOW (ref 26.0–34.0)
MCHC: 31.9 g/dL (ref 30.0–36.0)
MCV: 74.4 fL — ABNORMAL LOW (ref 78.0–100.0)
PLATELETS: 165 10*3/uL (ref 150–400)
RBC: 4.89 MIL/uL (ref 3.87–5.11)
RDW: 14.8 % (ref 11.5–15.5)
WBC: 6.8 10*3/uL (ref 4.0–10.5)

## 2015-02-25 NOTE — Pre-Procedure Instructions (Signed)
Dr Jillyn Hidden reviewed EKG.  H. [place] for surgery.

## 2015-02-28 NOTE — H&P (Signed)
Progress Notes      Nunzio Cobbs, MD at 02/19/2015 3:40 PM     Status: Signed       Expand All Collapse All   Patient ID: Angel Black, female DOB: Feb 06, 1966, 49 y.o. MRN: [id number] GYNECOLOGY VISIT  HPI: 49 y.o. Married [place] American female  [phone] with Patient's last menstrual period was 10/18/2008.  here to discuss upcoming surgery.  Desires hysterectomy for uterine fibroids.   Ultrasound on 02/03/15 - Multiple fibroids seen, largest 3.9 cm.  Adnexa unremarkable but ovaries not well seen.  EMS 5.5 mm.  No free fluid.  Status post ablation of endometrium.  Status post tubal ligation.  Declines future childbearing.   Patient is having pain episodes.  In March, pain started. Not necessarily associated with bleeding.  Also does have vaginal bleeding.  No prior diagnosis of endometriosis.   Works for [employer].   GYNECOLOGIC HISTORY: Patient's last menstrual period was 10/18/2008. Contraception: Tubal ligation Menopausal hormone therapy: n/a Last pap: 03-01-14 wnl:neg HR HPV Last mammogram: 03-19-14 fibroglandular density/[facility]   OB History    Gravida Para Term Preterm AB TAB SAB Ectopic Multiple Living   2 2 2       2        Patient Active Problem List   Diagnosis Date Noted  . Elevated blood pressure (not hypertension) 06/21/2014  . Fibroid     Past Medical History  Diagnosis Date  . H/O vitamin D deficiency   . Abnormal uterine bleeding   . Anemia 2013  . High blood pressure   . Fibroid 2011    Past Surgical History  Procedure Laterality Date  . Tubal ligation  1998  . Endometrial ablation  2010  . Bunionectomy Bilateral 2000, 1996    No current outpatient prescriptions on file.   No current facility-administered medications for this visit.     ALLERGIES: Review of patient's  allergies indicates no known allergies.  Family History  Problem Relation Age of Onset  . Cancer Neg Hx   . Diabetes Neg Hx   . Heart disease Neg Hx   . Stroke Neg Hx     History   Social History  . Marital Status: Married    Spouse Name: N/A  . Number of Children: N/A  . Years of Education: N/A   Occupational History  . Not on file.   Social History Main Topics  . Smoking status: Never Smoker   . Smokeless tobacco: Never Used  . Alcohol Use: No  . Drug Use: No  . Sexual Activity: Yes    Birth Control/ Protection: Surgical     Comment: Tubal   Other Topics Concern  . Not on file   Social History Narrative    ROS: Pertinent items are noted in HPI.  PHYSICAL EXAMINATION:   BP 140/88 mmHg  Pulse 60  Ht 5\' 3"  (1.6 m)  Wt 150 lb 12.8 oz (68.402 kg)  BMI 26.72 kg/m2  LMP 10/18/2008   General appearance: alert, cooperative and appears stated age Lungs: clear to auscultation bilaterally Heart: regular rate and rhythm Abdomen: soft, non-tender; 16 week size midline mass which is nontender. No abnormal inguinal nodes palpated  Pelvic: External genitalia: no lesions  Urethra: normal appearing urethra with no masses, tenderness or lesions  Bartholins and Skenes: normal   Vagina: normal appearing vagina with normal color and discharge, no lesions  Cervix: normal appearance    Bimanual  Exam: Uterus: uterus is 15 - 16 week size and nontender  Adnexa: normal adnexa in size, nontender and no masses    ASSESSMENT  Uterine fibroids.  Status post BTL.  Status post endometrial ablation.   PLAN  Discussed total abdominal hysterectomy with bilateral salpingectomy, possible bilateral oophorectomy. Risks, benefits, and alternatives discussed  with the patient who wishes to proceed.  Risks include but are not limited to bleeding, infection, damage to surrounding organs, reaction to anesthesia, pneumonia, DVT, PE, death, hernia formation, neuropathy, need for reoperation, surgical menopause if ovaries are removed and need for hormonal therapy, and continued pain after surgical recovery. Surgical expectations and recovery discussed.  Will remove ovaries if diseased.   Magnesium citrate bowel prep discussed.   An After Visit Summary was printed and given to the patient.  ___25___ minutes face to face time of which over 50% was spent in counseling.

## 2015-03-02 MED ORDER — DEXTROSE 5 % IV SOLN
2.0000 g | INTRAVENOUS | Status: AC
  Administered 2015-03-03: 2 g via INTRAVENOUS
  Filled 2015-03-02: qty 2

## 2015-03-03 ENCOUNTER — Inpatient Hospital Stay (HOSPITAL_COMMUNITY): Payer: 59 | Admitting: Certified Registered"

## 2015-03-03 ENCOUNTER — Inpatient Hospital Stay (HOSPITAL_COMMUNITY)
Admission: RE | Admit: 2015-03-03 | Discharge: 2015-03-05 | DRG: 743 | Disposition: A | Discharge status: Home / Self Care | Payer: 59 | Source: Ambulatory Visit | Attending: Obstetrics and Gynecology | Admitting: Obstetrics and Gynecology

## 2015-03-03 ENCOUNTER — Encounter: Payer: Self-pay | Admitting: Internal Medicine

## 2015-03-03 ENCOUNTER — Encounter (HOSPITAL_COMMUNITY): Payer: Self-pay | Admitting: Certified Registered"

## 2015-03-03 ENCOUNTER — Encounter (HOSPITAL_COMMUNITY)
Admission: RE | Disposition: A | Discharge status: Home / Self Care | Payer: Self-pay | Source: Ambulatory Visit | Attending: Obstetrics and Gynecology

## 2015-03-03 DIAGNOSIS — N939 Abnormal uterine and vaginal bleeding, unspecified: Secondary | ICD-10-CM | POA: Diagnosis present

## 2015-03-03 DIAGNOSIS — R030 Elevated blood-pressure reading, without diagnosis of hypertension: Secondary | ICD-10-CM | POA: Diagnosis present

## 2015-03-03 DIAGNOSIS — Z90710 Acquired absence of both cervix and uterus: Secondary | ICD-10-CM | POA: Diagnosis present

## 2015-03-03 DIAGNOSIS — Z9851 Tubal ligation status: Secondary | ICD-10-CM | POA: Diagnosis not present

## 2015-03-03 DIAGNOSIS — D649 Anemia, unspecified: Secondary | ICD-10-CM | POA: Diagnosis present

## 2015-03-03 DIAGNOSIS — D259 Leiomyoma of uterus, unspecified: Principal | ICD-10-CM | POA: Diagnosis present

## 2015-03-03 HISTORY — PX: BILATERAL SALPINGECTOMY: SHX5743

## 2015-03-03 HISTORY — PX: ABDOMINAL HYSTERECTOMY: SHX81

## 2015-03-03 LAB — PREGNANCY, URINE: Preg Test, Ur: NEGATIVE

## 2015-03-03 SURGERY — HYSTERECTOMY, ABDOMINAL
Anesthesia: General | Site: Abdomen

## 2015-03-03 MED ORDER — ROCURONIUM BROMIDE 100 MG/10ML IV SOLN
INTRAVENOUS | Status: DC | PRN
  Administered 2015-03-03: 5 mg via INTRAVENOUS
  Administered 2015-03-03: 40 mg via INTRAVENOUS
  Administered 2015-03-03: 5 mg via INTRAVENOUS
  Administered 2015-03-03: 10 mg via INTRAVENOUS
  Administered 2015-03-03: 5 mg via INTRAVENOUS

## 2015-03-03 MED ORDER — ONDANSETRON HCL 4 MG/2ML IJ SOLN
INTRAMUSCULAR | Status: DC | PRN
  Administered 2015-03-03: 4 mg via INTRAVENOUS

## 2015-03-03 MED ORDER — OXYCODONE HCL 5 MG PO TABS: 5.0000 mg | ORAL_TABLET | Freq: Once | ORAL | Status: DC | PRN

## 2015-03-03 MED ORDER — HYDROMORPHONE HCL 1 MG/ML IJ SOLN
0.2500 mg | INTRAMUSCULAR | Status: DC | PRN
  Administered 2015-03-03: 0.25 mg via INTRAVENOUS

## 2015-03-03 MED ORDER — KETOROLAC TROMETHAMINE 30 MG/ML IJ SOLN
30.0000 mg | Freq: Four times a day (QID) | INTRAMUSCULAR | Status: AC
  Administered 2015-03-03 – 2015-03-04 (×4): 30 mg via INTRAVENOUS
  Filled 2015-03-03 (×4): qty 1

## 2015-03-03 MED ORDER — FENTANYL CITRATE (PF) 100 MCG/2ML IJ SOLN
INTRAMUSCULAR | Status: AC
  Filled 2015-03-03: qty 2

## 2015-03-03 MED ORDER — OXYCODONE-ACETAMINOPHEN 5-325 MG PO TABS: 1.0000 | ORAL_TABLET | ORAL | Status: DC | PRN

## 2015-03-03 MED ORDER — ACETAMINOPHEN 160 MG/5ML PO SOLN: 325.0000 mg | ORAL | Status: DC | PRN

## 2015-03-03 MED ORDER — MORPHINE SULFATE (PF) 1 MG/ML IV SOLN
INTRAVENOUS | Status: DC
  Administered 2015-03-03: 12:00:00 via INTRAVENOUS
  Administered 2015-03-03: 7 mg via INTRAVENOUS
  Administered 2015-03-04: 1 mg via INTRAVENOUS
  Administered 2015-03-04: 4 mg via INTRAVENOUS
  Filled 2015-03-03: qty 25

## 2015-03-03 MED ORDER — HYDROMORPHONE HCL 1 MG/ML IJ SOLN
INTRAMUSCULAR | Status: DC | PRN
Start: 2015-03-03 — End: 2015-03-03
  Administered 2015-03-03 (×10): .1 mg via INTRAVENOUS

## 2015-03-03 MED ORDER — LACTATED RINGERS IV SOLN
INTRAVENOUS | Status: DC
  Administered 2015-03-03 – 2015-03-04 (×2): via INTRAVENOUS

## 2015-03-03 MED ORDER — NALOXONE HCL 0.4 MG/ML IJ SOLN: 0.4000 mg | INTRAMUSCULAR | Status: DC | PRN

## 2015-03-03 MED ORDER — DEXAMETHASONE SODIUM PHOSPHATE 10 MG/ML IJ SOLN
INTRAMUSCULAR | Status: DC | PRN
  Administered 2015-03-03: 4 mg via INTRAVENOUS

## 2015-03-03 MED ORDER — BUPIVACAINE LIPOSOME 1.3 % IJ SUSP
20.0000 mL | Freq: Once | INTRAMUSCULAR | Status: AC
  Administered 2015-03-03: 20 mL
  Filled 2015-03-03: qty 20

## 2015-03-03 MED ORDER — PROPOFOL 10 MG/ML IV BOLUS
INTRAVENOUS | Status: AC
  Filled 2015-03-03: qty 20

## 2015-03-03 MED ORDER — LACTATED RINGERS IV SOLN
INTRAVENOUS | Status: DC
  Administered 2015-03-03: 07:00:00 via INTRAVENOUS

## 2015-03-03 MED ORDER — ROCURONIUM BROMIDE 100 MG/10ML IV SOLN
INTRAVENOUS | Status: AC
  Filled 2015-03-03: qty 1

## 2015-03-03 MED ORDER — KETOROLAC TROMETHAMINE 30 MG/ML IJ SOLN
INTRAMUSCULAR | Status: DC | PRN
  Administered 2015-03-03: 30 mg via INTRAVENOUS

## 2015-03-03 MED ORDER — KETOROLAC TROMETHAMINE 30 MG/ML IJ SOLN
INTRAMUSCULAR | Status: AC
  Filled 2015-03-03: qty 1

## 2015-03-03 MED ORDER — KETOROLAC TROMETHAMINE 30 MG/ML IJ SOLN
INTRAMUSCULAR | Status: AC
  Filled 2015-03-03: qty 2

## 2015-03-03 MED ORDER — ACETAMINOPHEN 325 MG PO TABS: 325.0000 mg | ORAL_TABLET | ORAL | Status: DC | PRN

## 2015-03-03 MED ORDER — MIDAZOLAM HCL 2 MG/2ML IJ SOLN
INTRAMUSCULAR | Status: AC
  Filled 2015-03-03: qty 2

## 2015-03-03 MED ORDER — NEOSTIGMINE METHYLSULFATE 10 MG/10ML IV SOLN
INTRAVENOUS | Status: DC | PRN
  Administered 2015-03-03: 3 mg via INTRAVENOUS

## 2015-03-03 MED ORDER — HYDROMORPHONE HCL 1 MG/ML IJ SOLN
INTRAMUSCULAR | Status: AC
  Filled 2015-03-03: qty 1

## 2015-03-03 MED ORDER — BUPIVACAINE HCL (PF) 0.25 % IJ SOLN
INTRAMUSCULAR | Status: DC | PRN
  Administered 2015-03-03: 30 mL

## 2015-03-03 MED ORDER — SCOPOLAMINE 1 MG/3DAYS TD PT72
MEDICATED_PATCH | TRANSDERMAL | Status: DC
Start: 2015-03-03 — End: 2015-03-05
  Administered 2015-03-03: 1.5 mg via TRANSDERMAL
  Filled 2015-03-03: qty 1

## 2015-03-03 MED ORDER — LIDOCAINE HCL (CARDIAC) 20 MG/ML IV SOLN
INTRAVENOUS | Status: DC | PRN
  Administered 2015-03-03: 60 mg via INTRAVENOUS

## 2015-03-03 MED ORDER — LACTATED RINGERS IV SOLN
INTRAVENOUS | Status: DC
  Administered 2015-03-03 (×3): via INTRAVENOUS

## 2015-03-03 MED ORDER — ONDANSETRON HCL 4 MG PO TABS: 4.0000 mg | ORAL_TABLET | Freq: Four times a day (QID) | ORAL | Status: DC | PRN

## 2015-03-03 MED ORDER — GLYCOPYRROLATE 0.2 MG/ML IJ SOLN
INTRAMUSCULAR | Status: AC
  Filled 2015-03-03: qty 3

## 2015-03-03 MED ORDER — FENTANYL CITRATE (PF) 100 MCG/2ML IJ SOLN
INTRAMUSCULAR | Status: DC | PRN
  Administered 2015-03-03 (×3): 25 ug via INTRAVENOUS
  Administered 2015-03-03: 100 ug via INTRAVENOUS
  Administered 2015-03-03: 25 ug via INTRAVENOUS

## 2015-03-03 MED ORDER — SCOPOLAMINE 1 MG/3DAYS TD PT72
1.0000 | MEDICATED_PATCH | TRANSDERMAL | Status: DC
  Administered 2015-03-03: 1.5 mg via TRANSDERMAL

## 2015-03-03 MED ORDER — ONDANSETRON HCL 4 MG/2ML IJ SOLN: 4.0000 mg | Freq: Four times a day (QID) | INTRAMUSCULAR | Status: DC | PRN

## 2015-03-03 MED ORDER — BUPIVACAINE HCL (PF) 0.25 % IJ SOLN
INTRAMUSCULAR | Status: AC
  Filled 2015-03-03: qty 30

## 2015-03-03 MED ORDER — SODIUM CHLORIDE 0.9 % IJ SOLN
9.0000 mL | INTRAMUSCULAR | Status: DC | PRN
  Administered 2015-03-05: 3 mL via INTRAVENOUS
  Filled 2015-03-03: qty 9

## 2015-03-03 MED ORDER — PROPOFOL INFUSION 10 MG/ML OPTIME
INTRAVENOUS | Status: DC | PRN
  Administered 2015-03-03: 10 ug/kg/min via INTRAVENOUS

## 2015-03-03 MED ORDER — DEXAMETHASONE SODIUM PHOSPHATE 4 MG/ML IJ SOLN
INTRAMUSCULAR | Status: AC
  Filled 2015-03-03: qty 1

## 2015-03-03 MED ORDER — OXYCODONE HCL 5 MG/5ML PO SOLN: 5.0000 mg | Freq: Once | ORAL | Status: DC | PRN

## 2015-03-03 MED ORDER — GLYCOPYRROLATE 0.2 MG/ML IJ SOLN
INTRAMUSCULAR | Status: DC | PRN
  Administered 2015-03-03: .5 mg via INTRAVENOUS
  Administered 2015-03-03: .15 mg via INTRAVENOUS

## 2015-03-03 MED ORDER — LIDOCAINE HCL (PF) 1 % IJ SOLN
INTRAMUSCULAR | Status: AC
  Filled 2015-03-03: qty 5

## 2015-03-03 MED ORDER — DIPHENHYDRAMINE HCL 12.5 MG/5ML PO ELIX: 12.5000 mg | ORAL_SOLUTION | Freq: Four times a day (QID) | ORAL | Status: DC | PRN

## 2015-03-03 MED ORDER — IBUPROFEN 600 MG PO TABS
600.0000 mg | ORAL_TABLET | Freq: Four times a day (QID) | ORAL | Status: DC | PRN
  Administered 2015-03-04 – 2015-03-05 (×2): 600 mg via ORAL
  Filled 2015-03-03 (×2): qty 1

## 2015-03-03 MED ORDER — DIPHENHYDRAMINE HCL 50 MG/ML IJ SOLN: 12.5000 mg | Freq: Four times a day (QID) | INTRAMUSCULAR | Status: DC | PRN

## 2015-03-03 MED ORDER — PROPOFOL 10 MG/ML IV BOLUS
INTRAVENOUS | Status: DC | PRN
  Administered 2015-03-03 (×2): 10 mg via INTRAVENOUS
  Administered 2015-03-03: 150 mg via INTRAVENOUS
  Administered 2015-03-03: 10 mg via INTRAVENOUS

## 2015-03-03 MED ORDER — ONDANSETRON HCL 4 MG/2ML IJ SOLN
INTRAMUSCULAR | Status: AC
  Filled 2015-03-03: qty 2

## 2015-03-03 MED ORDER — NEOSTIGMINE METHYLSULFATE 10 MG/10ML IV SOLN
INTRAVENOUS | Status: AC
  Filled 2015-03-03: qty 1

## 2015-03-03 MED ORDER — MENTHOL 3 MG MT LOZG: 1.0000 | LOZENGE | OROMUCOSAL | Status: DC | PRN

## 2015-03-03 SURGICAL SUPPLY — 43 items
BENZOIN TINCTURE PRP APPL 2/3 (GAUZE/BANDAGES/DRESSINGS) ×4 IMPLANT
BLADE SURG CLIPPER 3M 9600 (MISCELLANEOUS) ×4 IMPLANT
CANISTER SUCT 3000ML (MISCELLANEOUS) ×4 IMPLANT
CELLS DAT CNTRL 66122 CELL SVR (MISCELLANEOUS) ×3 IMPLANT
CLOTH BEACON ORANGE TIMEOUT ST (SAFETY) ×4 IMPLANT
DECANTER SPIKE VIAL GLASS SM (MISCELLANEOUS) ×4 IMPLANT
DRAPE WARM FLUID 44X44 (DRAPE) ×4 IMPLANT
DRSG OPSITE POSTOP 4X10 (GAUZE/BANDAGES/DRESSINGS) ×4 IMPLANT
DURAPREP 26ML APPLICATOR (WOUND CARE) ×4 IMPLANT
ELECT BLADE 6.5 EXT (BLADE) ×4 IMPLANT
GAUZE SPONGE 4X4 16PLY XRAY LF (GAUZE/BANDAGES/DRESSINGS) ×4 IMPLANT
GLOVE BIO SURGEON STRL SZ 6.5 (GLOVE) ×8 IMPLANT
GLOVE BIOGEL PI IND STRL 6.5 (GLOVE) ×6 IMPLANT
GLOVE BIOGEL PI IND STRL 7.0 (GLOVE) ×9 IMPLANT
GLOVE BIOGEL PI INDICATOR 6.5 (GLOVE) ×2
GLOVE BIOGEL PI INDICATOR 7.0 (GLOVE) ×3
GLOVE ECLIPSE 6.5 STRL STRAW (GLOVE) ×8 IMPLANT
GLOVE NEODERM STER SZ 7 (GLOVE) ×4 IMPLANT
GLOVE SURG SS PI 7.0 STRL IVOR (GLOVE) ×24 IMPLANT
GOWN STRL REUS W/TWL LRG LVL3 (GOWN DISPOSABLE) ×16 IMPLANT
GOWN STRL REUS W/TWL LRG LVL4 (GOWN DISPOSABLE) ×4 IMPLANT
NEEDLE HYPO 22GX1.5 SAFETY (NEEDLE) ×4 IMPLANT
NS IRRIG 1000ML POUR BTL (IV SOLUTION) ×4 IMPLANT
PACK ABDOMINAL GYN (CUSTOM PROCEDURE TRAY) ×4 IMPLANT
PAD OB MATERNITY 4.3X12.25 (PERSONAL CARE ITEMS) ×4 IMPLANT
PROTECTOR NERVE ULNAR (MISCELLANEOUS) ×4 IMPLANT
RTRCTR WOUND ALEXIS 18CM MED (MISCELLANEOUS) ×4
SET CYSTO W/LG BORE CLAMP LF (SET/KITS/TRAYS/PACK) ×4 IMPLANT
SHEET LAVH (DRAPES) ×4 IMPLANT
SPONGE LAP 18X18 X RAY DECT (DISPOSABLE) ×12 IMPLANT
STRIP CLOSURE SKIN 1/2X4 (GAUZE/BANDAGES/DRESSINGS) ×4 IMPLANT
SUT PLAIN 2 0 XLH (SUTURE) ×4 IMPLANT
SUT VIC AB 0 CT1 18XCR BRD8 (SUTURE) ×9 IMPLANT
SUT VIC AB 0 CT1 27 (SUTURE) ×3
SUT VIC AB 0 CT1 27XBRD ANBCTR (SUTURE) ×9 IMPLANT
SUT VIC AB 0 CT1 8-18 (SUTURE) ×3
SUT VIC AB 2-0 CT1 27 (SUTURE) ×1
SUT VIC AB 2-0 CT1 TAPERPNT 27 (SUTURE) ×3 IMPLANT
SUT VICRYL 0 TIES 12 18 (SUTURE) ×4 IMPLANT
SYR CONTROL 10ML LL (SYRINGE) ×4 IMPLANT
TOWEL OR 17X24 6PK STRL BLUE (TOWEL DISPOSABLE) ×12 IMPLANT
TRAY FOLEY CATH SILVER 14FR (SET/KITS/TRAYS/PACK) ×4 IMPLANT
WATER STERILE IRR 1000ML POUR (IV SOLUTION) ×4 IMPLANT

## 2015-03-03 NOTE — Anesthesia Postprocedure Evaluation (Signed)
  Anesthesia Post-op Note  Patient: Angel Black  Procedure(s) Performed: Procedure(s) with comments: HYSTERECTOMY ABDOMINAL (N/A) - 2 hours BILATERAL SALPINGECTOMY (Bilateral)  Patient Location: PACU  Anesthesia Type:General  Level of Consciousness: awake  Airway and Oxygen Therapy: Patient Spontanous Breathing  Post-op Pain: mild  Post-op Assessment: Post-op Vital signs reviewed, Patient's Cardiovascular Status Stable, Respiratory Function Stable, Patent Airway, No signs of Nausea or vomiting and Pain level controlled  Post-op Vital Signs: Reviewed and stable  Last Vitals:  Filed Vitals:   03/03/15 1200  BP: 107/63  Pulse: 69  Temp: 36.7 C  Resp: 16    Complications: No apparent anesthesia complications

## 2015-03-03 NOTE — Anesthesia Preprocedure Evaluation (Signed)
Anesthesia Evaluation  Patient identified by MRN, date of birth, ID band Patient awake    Reviewed: Allergy & Precautions, NPO status , Patient's Chart, lab work & pertinent test results  History of Anesthesia Complications Negative for: history of anesthetic complications  Airway Mallampati: II  TM Distance: >3 FB Neck ROM: Full    Dental  (+) Teeth Intact   Pulmonary neg pulmonary ROS,  breath sounds clear to auscultation        Cardiovascular negative cardio ROS  Rhythm:Regular     Neuro/Psych negative neurological ROS  negative psych ROS   GI/Hepatic Neg liver ROS,   Endo/Other  negative endocrine ROS  Renal/GU negative Renal ROS     Musculoskeletal negative musculoskeletal ROS (+)   Abdominal   Peds  Hematology negative hematology ROS (+)   Anesthesia Other Findings   Reproductive/Obstetrics                             Anesthesia Physical Anesthesia Plan  ASA: II  Anesthesia Plan: General   Post-op Pain Management:    Induction: Intravenous  Airway Management Planned: Oral ETT  Additional Equipment: None  Intra-op Plan:   Post-operative Plan: Extubation in OR  Informed Consent: I have reviewed the patients History and Physical, chart, labs and discussed the procedure including the risks, benefits and alternatives for the proposed anesthesia with the patient or authorized representative who has indicated his/her understanding and acceptance.   Dental advisory given  Plan Discussed with: CRNA and Surgeon  Anesthesia Plan Comments:         Anesthesia Quick Evaluation

## 2015-03-03 NOTE — Transfer of Care (Signed)
Immediate Anesthesia Transfer of Care Note  Patient: Angel Black  Procedure(s) Performed: Procedure(s) with comments: HYSTERECTOMY ABDOMINAL (N/A) - 2 hours BILATERAL SALPINGECTOMY (Bilateral)  Patient Location: PACU  Anesthesia Type:General  Level of Consciousness: awake, alert , oriented and patient cooperative  Airway & Oxygen Therapy: Patient Spontanous Breathing and Patient connected to nasal cannula oxygen  Post-op Assessment: Report given to RN and Post -op Vital signs reviewed and stable  Post vital signs: Reviewed and stable  Last Vitals:  Filed Vitals:   03/03/15 0609  BP: 128/76  Pulse: 65  Temp: 36.9 C  Resp: 20    Complications: No apparent anesthesia complications

## 2015-03-03 NOTE — Progress Notes (Signed)
Update to History and Physical  No marked change in status since office preop visit.   Patient examined.   OK to proceed.  

## 2015-03-03 NOTE — Progress Notes (Signed)
Day of Surgery Procedure(s) (LRB): HYSTERECTOMY ABDOMINAL (N/A) BILATERAL SALPINGECTOMY (Bilateral)  Subjective: Patient reports good pain control.  Ambulated once.  Not hungry.   Objective: I have reviewed patient's vital signs and intake and output. BP 137/76, P 69, RR 16, Temp 98.8 UO - 1400 cc clear yellow urine.   General: alert and cooperative Resp: clear to auscultation bilaterally Cardio: regular rate and rhythm, S1, S2 normal, no murmur, click, rub or gallop GI: incision: intact and  mild amoun ot bloody drainage. Scant bowel sounds, soft, nontender. Extremities: Ted hose and PAS on.  DPs 2+ bilaterally.  Vaginal Bleeding: none  Assessment: s/p Procedure(s) with comments: HYSTERECTOMY ABDOMINAL (N/A) - 2 hours BILATERAL SALPINGECTOMY (Bilateral): stable  Plan: Morphine PCA and Toradol for pain control.  Foley overnight.  CBC and BMP in am.  Surgical findings and procedure discussed.  Questions answered.    LOS: 0 days    Darcel Bayley 03/03/2015, 7:54 PM

## 2015-03-03 NOTE — Brief Op Note (Signed)
03/03/2015  10:02 AM  PATIENT:  Angel Black  49 y.o. female  PRE-OPERATIVE DIAGNOSIS:  uterine fibroids, pelvic pain, previous endometrial ablation and postpartum tubal ligation,   POST-OPERATIVE DIAGNOSIS:  uterine fibroids, pelvic pain, previous endometrial ablation and post partum tubal ligation   PROCEDURE:  Procedure(s) with comments: HYSTERECTOMY ABDOMINAL (N/A) - 2 hours BILATERAL SALPINGECTOMY (Bilateral)  SURGEON:  Surgeon(s) and Role:    * Sinan Tuch E Yisroel Ramming, MD - Primary    * Megan Salon, MD - Assisting  PHYSICIAN ASSISTANT: NA  ASSISTANTS: Lyman Speller, MD   ANESTHESIA:   general and Experil (20 cc) and Marcaine 0.25 (10 cc) - total of 30 cc subcutaneously.  EBL:  Total I/O In: 2000 [I.V.:2000] Out: 500 [Urine:300; Blood:200]  BLOOD ADMINISTERED:none  DRAINS: Urinary Catheter (Foley)   LOCAL MEDICATIONS USED:  OTHER Experil (20 cc) and Marcaine 0.25 (10 cc) - total of 30 cc subcutaneously.  SPECIMEN:  Source of Specimen:  Uterus, cervix, and bilateral tubes  DISPOSITION OF SPECIMEN:  PATHOLOGY  COUNTS:  YES  TOURNIQUET:  * No tourniquets in log *  DICTATION: .Other Dictation: Dictation Number    PLAN OF CARE: Admit to inpatient   PATIENT DISPOSITION:  PACU - hemodynamically stable.   Delay start of Pharmacological VTE agent (>24hrs) due to surgical blood loss or risk of bleeding: not applicable

## 2015-03-04 ENCOUNTER — Encounter (HOSPITAL_COMMUNITY): Payer: Self-pay | Admitting: Obstetrics and Gynecology

## 2015-03-04 LAB — CBC
HCT: 33.6 % — ABNORMAL LOW (ref 36.0–46.0)
HEMOGLOBIN: 10.8 g/dL — AB (ref 12.0–15.0)
MCH: 23.9 pg — ABNORMAL LOW (ref 26.0–34.0)
MCHC: 32.1 g/dL (ref 30.0–36.0)
MCV: 74.3 fL — ABNORMAL LOW (ref 78.0–100.0)
Platelets: 146 10*3/uL — ABNORMAL LOW (ref 150–400)
RBC: 4.52 MIL/uL (ref 3.87–5.11)
RDW: 14.8 % (ref 11.5–15.5)
WBC: 13.9 10*3/uL — ABNORMAL HIGH (ref 4.0–10.5)

## 2015-03-04 LAB — BASIC METABOLIC PANEL
ANION GAP: 6 (ref 5–15)
BUN: 7 mg/dL (ref 6–20)
CALCIUM: 8.3 mg/dL — AB (ref 8.9–10.3)
CHLORIDE: 103 mmol/L (ref 101–111)
CO2: 28 mmol/L (ref 22–32)
CREATININE: 0.66 mg/dL (ref 0.44–1.00)
GFR calc non Af Amer: >60 mL/min (ref >60)
Glucose, Bld: 104 mg/dL — ABNORMAL HIGH (ref 65–99)
Potassium: 4.4 mmol/L (ref 3.5–5.1)
Sodium: 137 mmol/L (ref 135–145)

## 2015-03-04 NOTE — Op Note (Signed)
NAMEMILA, PAIR NO.:  [id number]  MEDICAL RECORD NO.:  [id number]  LOCATION:  5885                          FACILITY:  [facility]  PHYSICIAN:  Lenard Galloway, M.D.   DATE OF BIRTH:  20-Jun-1966  DATE OF PROCEDURE:  03/03/2015 DATE OF DISCHARGE:                              OPERATIVE REPORT   PREOPERATIVE DIAGNOSES:  Uterine fibroids, pelvic pain, previous endometrial ablation, previous postpartum tubal ligation.  POSTOPERATIVE DIAGNOSES:  Uterine fibroids, pelvic pain, previous endometrial ablation, previous postpartum tubal ligation.  PROCEDURE:  A total abdominal hysterectomy with bilateral salpingectomy.  SURGEON:  Lenard Galloway, MD  ASSISTANT:  Felipa Emory, MD  ANESTHESIA:  General endotracheal, subcutaneous Exparel 20 mL mixed with Marcaine 0.25%, 10 mL.  IV FLUIDS:  2000 mL Ringer's lactate.  EBL:  200 mL.  URINE OUTPUT:  300 mL.  COMPLICATIONS:  None.  INDICATIONS FOR THE PROCEDURE:  The patient is a 50 year old gravida 2, para 2, African American female status post endometrial ablation in 2010, and status post postpartum tubal ligation in 1998, who presented with pelvic pain, vaginal bleeding, and an ultrasound, which documented multiple uterine fibroids.  The ovaries were not well seen at the time of the pelvic ultrasound.  The patient presented requesting surgical treatment with hysterectomy.  A plan is made to proceed with a total abdominal hysterectomy with bilateral salpingectomy and possible bilateral oophorectomy after risks, benefits and alternatives are reviewed.  FINDINGS:  Examination under anesthesia revealed a 14 week size uterus with multiple fibroids.  At the time of laparotomy, the patient was noted to have multiple subserosal and intramural fibroids.  The uterine fundus had a boggy texture to it.  The fallopian tubes were consistent with tubal ligation. There was a small 1 cm simple left ovarian cyst and a 1 cm left  corpus luteum cyst noted.  The right ovary was unremarkable.  There was no evidence of any endometriosis in the pelvis.  In the upper abdomen, the liver, gallbladder, paraaortic regions and kidneys were palpably normal.  The appendix was not visualized.  SPECIMENS:  The uterus, cervix and bilateral fallopian tubes were sent to Pathology.  PROCEDURE IN DETAIL:  The patient was reidentified in the preoperative hold area.  She received cefotetan 2 g IV for antibiotic prophylaxis and she received TED hose and PAS stockings for DVT prophylaxis.  In the operating room, the patient was placed in the dorsal lithotomy position with Allen stirrups.  General endotracheal anesthesia was induced.  The vagina and abdomen were then sterilely prepped and draped. A Foley catheter was placed sterilely inside the bladder and left to gravity drainage.  The procedure began by creating a Pfannenstiel incision with a scalpel. Monopolar cautery was used to come through the subcutaneous layer.  The fascia was then incised with a scalpel.  The fascial incision was extended bilaterally with the Mayo scissors.  The rectus muscles were then sharply divided in the midline.  The posterior rectus sheath and parietal peritoneum were elevated with 2 hemostat clamps and then entered sharply.  The peritoneal incision was extended cranially and caudally. The pelvis was explored at this time.  A small Alexis retractor was placed inside the peritoneal cavity.  Kelly clamps were placed across the adnexal structures bilaterally.  The right round ligament was grasped with a Babcock clamp and was suture ligated with a transfixing suture of 0 Vicryl at this time.  The round ligament was then divided with monopolar cautery and the peritoneum was opened anteriorly and posteriorly using monopolar cautery and using Metzenbaum scissors.  The bladder flap was taken down anteriorly on the patient's right hand side using a  Metzenbaum scissors and monopolar cautery for hemostasis.  A salpingectomy was performed at this time on the patient's right hand side.  Monopolar cautery was used to disconnect the fimbriated end from the right ovary.  A window was then created through the mesosalpinx and the vessel in the mesosalpinx was clamped sharply, divided, and then suture ligated with 0 Vicryl.  This was continued all the way through the mesosalpinx to the proximal fallopian tube.  The fallopian tube was excised at this time and set aside for Pathology.  A window was created through the posterior leaf of the broad ligament.  The utero-ovarian pedicle was clamped and sharply divided.  It was free tied with 0 Vicryl followed by suture ligature of the same.  The posterior peritoneum was taken down on the patient's right hand side. The same procedure that was performed on the patient's right hand side was then repeated on the patient's left hand side.  Please note that the ureters were each identified along the pelvic sidewalls.  The bladder flap was further taken down anteriorly in the midline using sharp dissection.  Again monopolar cautery was used for hemostasis. Each of the uterine arteries were skeletonized.  The uterine arteries were then doubly clamped with Heaney clamps, sharply divided, and then suture ligated with 2 sutures of 0 Vicryl.  Each of the inferior aspects of the cardinal ligaments were then clamped, sharply divided, and suture ligated with 0 Vicryl bilaterally.  The uterosacral ligaments were clamped, sharply divided, and suture ligated with 0 Vicryl.  Curved Heaney clamps were then placed across the top of the vagina just below the cervix.  The pedicles were sharply excised and they were suture ligated with transfixing sutures of 0 Vicryl bilaterally.  The cervix was circumscribed from the vagina using a curved Mayo scissors.  The specimen was then sent to Pathology with the bilateral  fallopian tubes. Figure-of-eight sutures of 0 Vicryl was used to close the remaining vaginal cuff.  These sutures were all held.  The pelvis was irrigated and suctioned at this time.  Hemostasis was created with monopolar cautery just underneath the bladder flap and all peritoneal edges of the pelvic sidewall.  Care was taken to avoid the ureter as this was cauterized on the patient's left hand side.  All pedicle sites were examined and found to be hemostatic.  Held suture pedicles were cut at this time.  Two moistened lap pads which had been placed in the upper abdomen were removed and the Alexis retractor was removed. Exploration of the upper abdomen was performed at this time.  Please refer to the findings above.  The abdomen was closed at this time.  A running suture of 2-0 Vicryl was used to close the parietal peritoneum.  The rectus fascia was examined and found to be hemostatic.  The fascia was closed with a running suture of 0 Vicryl.  The subcutaneous layer was irrigated, suctioned and made hemostatic with monopolar cautery.  Interrupted sutures of 2-0  plain gut suture were placed after the subcutaneous layer was injected with the Exparel Marcaine mixture.  The skin was closed with a subcuticular suture of 4-0 Vicryl.  Steri-Strips and benzoin were placed on the skin and then the incision was covered with a honeycomb dressing.  The patient was cleansed of any remaining Betadine.  She was awakened, extubated and escorted to the recovery room in stable condition.  There were no complications to the procedure.  All needle, instrument, and sponge counts were correct.     Lenard Galloway, M.D.     BES/MEDQ  D:  03/03/2015  T:  03/04/2015  Job:  [id number]

## 2015-03-04 NOTE — Progress Notes (Signed)
1 Day Post-Op Procedure(s) (LRB): HYSTERECTOMY ABDOMINAL (N/A) BILATERAL SALPINGECTOMY (Bilateral)  Subjective: Patient reports tolerating PO.   Taking clear liquids.  No pain.  Foley out. No void yet.   Objective: I have reviewed patient's vital signs and intake and output.  UO 2600 cc.   Labs pending.   General: alert and cooperative Resp: clear to auscultation bilaterally Cardio: regular rate and rhythm, S1, S2 normal, no murmur, click, rub or gallop GI: soft, non-tender; bowel sounds normal; no masses,  no organomegaly and incision: Dressing with mild amount of dried blood. Vaginal Bleeding: minimal  Assessment: s/p Procedure(s) with comments: HYSTERECTOMY ABDOMINAL (N/A) - 2 hours BILATERAL SALPINGECTOMY (Bilateral): stable  Plan: Advance diet Encourage ambulation Advance to PO medication  Plan for dischargee tomorrow.    LOS: 1 day    Darcel Bayley 03/04/2015, 6:58 AM

## 2015-03-05 LAB — CBC WITH DIFFERENTIAL/PLATELET
BASOS ABS: 0 10*3/uL (ref 0.0–0.1)
BASOS PCT: 0 % (ref 0–1)
EOS ABS: 0.1 10*3/uL (ref 0.0–0.7)
EOS PCT: 1 % (ref 0–5)
HCT: 31.1 % — ABNORMAL LOW (ref 36.0–46.0)
Hemoglobin: 10 g/dL — ABNORMAL LOW (ref 12.0–15.0)
Lymphocytes Relative: 22 % (ref 12–46)
Lymphs Abs: 1.9 10*3/uL (ref 0.7–4.0)
MCH: 23.6 pg — ABNORMAL LOW (ref 26.0–34.0)
MCHC: 32.2 g/dL (ref 30.0–36.0)
MCV: 73.3 fL — ABNORMAL LOW (ref 78.0–100.0)
MONO ABS: 1 10*3/uL (ref 0.1–1.0)
MONOS PCT: 11 % (ref 3–12)
Neutro Abs: 5.7 10*3/uL (ref 1.7–7.7)
Neutrophils Relative %: 66 % (ref 43–77)
Platelets: 122 10*3/uL — ABNORMAL LOW (ref 150–400)
RBC: 4.24 MIL/uL (ref 3.87–5.11)
RDW: 14.8 % (ref 11.5–15.5)
WBC: 8.6 10*3/uL (ref 4.0–10.5)

## 2015-03-05 MED ORDER — OXYCODONE-ACETAMINOPHEN 5-325 MG PO TABS: 1.0000 | ORAL_TABLET | ORAL | Status: DC | PRN

## 2015-03-05 MED ORDER — DOCUSATE SODIUM 100 MG PO CAPS: ORAL_CAPSULE | ORAL | Status: DC

## 2015-03-05 MED ORDER — IBUPROFEN 600 MG PO TABS: 600.0000 mg | ORAL_TABLET | Freq: Four times a day (QID) | ORAL | Status: DC | PRN

## 2015-03-05 MED ORDER — FERROUS SULFATE 325 (65 FE) MG PO TBEC: DELAYED_RELEASE_TABLET | ORAL | Status: DC

## 2015-03-05 NOTE — Progress Notes (Signed)
Pt discharged to home with husband.  Condition stable.  Pt ambulated to car with Santiago Bur, NT.  No equipment for home ordered at discharge.

## 2015-03-05 NOTE — Progress Notes (Signed)
2 Days Post-Op Procedure(s) (LRB): HYSTERECTOMY ABDOMINAL (N/A) BILATERAL SALPINGECTOMY (Bilateral)  Subjective: Patient reports + flatus and no problems voiding.   Tolerating regular diet.  Ready for discharge.  Objective: I have reviewed patient's vital signs, intake and output, labs and pathology. Hgb 10.0. Pathology - benign fibroids.   General: alert and cooperative Resp: clear to auscultation bilaterally Cardio: regular rate and rhythm, S1, S2 normal, no murmur, click, rub or gallop GI: soft, non-tender; bowel sounds normal; no masses,  no organomegaly and incision: intact and Mild amount of mucousy blood staining.  Assessment: s/p Procedure(s) with comments: HYSTERECTOMY ABDOMINAL (N/A) - 2 hours BILATERAL SALPINGECTOMY (Bilateral): progressing well Anemia tolerated well.   Plan: Discharge home  Percocet and Motrin for pain.  Follow up in one week.  Instructions and precautions given.  Pathology report discussed.    LOS: 2 days    Arloa Koh 03/05/2015, 7:51 AM

## 2015-03-05 NOTE — Discharge Instructions (Signed)
Abdominal Hysterectomy, Care After Refer to this sheet in the next few weeks. These instructions provide you with information on caring for yourself after your procedure. Your health care provider may also give you more specific instructions. Your treatment has been planned according to current medical practices, but problems sometimes occur. Call your health care provider if you have any problems or questions after your procedure.  WHAT TO EXPECT AFTER THE PROCEDURE After your procedure, it is typical to have the following:  Pain.  Feeling tired.  Poor appetite.  Less interest in sex. HOME CARE INSTRUCTIONS  It takes 4-6 weeks to recover from this surgery. Make sure you follow all your health care provider's instructions. Home care instructions may include:  Take pain medicines only as directed by your health care provider. Do not take over-the-counter pain medicines without checking with your health care provider first.  Change your bandage as directed by your health care provider.  Return to your health care provider to have your sutures taken out.  Take showers instead of baths for 2-3 weeks. Ask your health care provider when it is safe to start showering.  Do not douche, use tampons, or have sexual intercourse for at least 6 weeks or until your health care provider says you can.   Follow your health care provider's advice about exercise, lifting, driving, and general activities.  Get plenty of rest and sleep.   Do not lift anything heavier than a gallon of milk (about 10 lb [4.5 kg]) for the first month after surgery.  You can resume your normal diet if your health care provider says it is okay.   Do not drink alcohol until your health care provider says you can.   If you are constipated, ask your health care provider if you can take a mild laxative.  Eating foods high in fiber may also help with constipation. Eat plenty of raw fruits and vegetables, whole grains, and  beans.  Drink enough fluids to keep your urine clear or pale yellow.   Try to have someone at home with you for the first 1-2 weeks to help around the house.  Keep all follow-up appointments. SEEK MEDICAL CARE IF:   You have chills or fever.  You have swelling, redness, or pain in the area of your incision that is getting worse.   You have pus coming from the incision.   You notice a bad smell coming from the incision or bandage.   Your incision breaks open.   You feel dizzy or light-headed.   You have pain or bleeding when you urinate.   You have persistent diarrhea.   You have persistent nausea and vomiting.   You have abnormal vaginal discharge.   You have a rash.   You have any type of abnormal reaction or develop an allergy to your medicine.   Your pain medicine is not helping.  SEEK IMMEDIATE MEDICAL CARE IF:   You have a fever and your symptoms suddenly get worse.  You have severe abdominal pain.  You have chest pain.  You have shortness of breath.  You faint.  You have pain, swelling, or redness of your leg.  You have heavy vaginal bleeding with blood clots. MAKE SURE YOU:  Understand these instructions.  Will watch your condition.  Will get help right away if you are not doing well or get worse. Document Released: 04/23/2005 Document Revised: 10/09/2013 Document Reviewed: 07/27/2013 Exit[employer] Patient Information 2015 [employer]. This information is not intended   to replace advice given to you by your health care provider. Make sure you discuss any questions you have with your health care provider.  

## 2015-03-07 ENCOUNTER — Encounter: Payer: Self-pay | Admitting: Obstetrics and Gynecology

## 2015-03-07 ENCOUNTER — Telehealth: Payer: Self-pay | Admitting: Obstetrics and Gynecology

## 2015-03-07 ENCOUNTER — Ambulatory Visit: Payer: Self-pay | Admitting: Gynecology

## 2015-03-07 ENCOUNTER — Ambulatory Visit (INDEPENDENT_AMBULATORY_CARE_PROVIDER_SITE_OTHER): Payer: 59 | Admitting: Obstetrics and Gynecology

## 2015-03-07 VITALS — BP 122/84 | HR 56 | Ht 63.0 in | Wt 164.2 lb

## 2015-03-07 DIAGNOSIS — Z9889 Other specified postprocedural states: Secondary | ICD-10-CM

## 2015-03-07 NOTE — Telephone Encounter (Signed)
Reviewed with Dr. Quincy Simmonds.  Patient to be seen today at 1:00 for work in incision check. Notified patient and she is agreeable. Routing to provider for final review. Patient agreeable to disposition. Will close encounter.

## 2015-03-07 NOTE — Telephone Encounter (Signed)
Spoke with patient. She states she returned home from the hospital on Wednesday evening and noticed "a great deal of swelling" on her abdomen. She states the swelling  is not near incision but above it, and extends across abdomen on the right side to the L flank. Patient denies complaints, no abdominal pain, voiding well, passing gas and having BM's, no vaginal bleeding, no vaginal discharge, no sob. Pain is controlled with Motrin. Patient states that she is calling not because she is noticing an increase in this swelling but that it is remaining the same and her instructions state to call if any swelling near incision.   Advised patient would review with Dr. Quincy Simmonds and return call. Patient agreeable.

## 2015-03-07 NOTE — Progress Notes (Signed)
Patient ID: Angel Black, female   DOB: 02/11/1966, 49 y.o.   MRN: [id number] GYNECOLOGY  VISIT   HPI: 49 y.o.   Married  [place] American  female   [phone] with Patient's last menstrual period was 10/18/2008.   here for complaint of abdominal swelling 4 days post op  HYSTERECTOMY ABDOMINAL (N/A Abdomen)  BILATERAL SALPINGECTOMY (Bilateral Abdomen)   Swelling across her abdomen from right to left side and extending into her back.  Started on 03/05/15 after discharge from hospital.  Increased on 5/191/6.  Having BMs, last time was this am.  Having normal BMs. Took Scopalamine off.  Tolerating food.  No fevers.  Voiding well.  Very slight bleeding.    Using pain medication sparingly.   GYNECOLOGIC HISTORY: Patient's last menstrual period was 10/18/2008. Contraception  Tubal/TAH Menopausal hormone therapy: n/a Last mammogram: 03-19-14 dense/nl:[facility] Last pap smear: 03-01-14 wnl:neg HR HPV        OB History    Gravida Para Term Preterm AB TAB SAB Ectopic Multiple Living   2 2 2       2          Patient Active Problem List   Diagnosis Date Noted  . Status post total abdominal hysterectomy 03/03/2015  . Elevated blood pressure (not hypertension) 06/21/2014  . Fibroid     Past Medical History  Diagnosis Date  . H/O vitamin D deficiency   . Abnormal uterine bleeding   . Anemia 2013  . Fibroid 2011  . SVD (spontaneous vaginal delivery)     x 2  . High blood pressure     no current meds    Past Surgical History  Procedure Laterality Date  . Tubal ligation  1998  . Endometrial ablation  2010  . Bunionectomy Bilateral 2000, 1996  . Wisdom tooth extraction    . Abdominal hysterectomy N/A 03/03/2015    Procedure: HYSTERECTOMY ABDOMINAL;  Surgeon: Nunzio Cobbs, MD;  Location: [facility];  Service: Gynecology;  Laterality: N/A;  2 hours  . Bilateral salpingectomy Bilateral 03/03/2015    Procedure: BILATERAL SALPINGECTOMY;  Surgeon: Nunzio Cobbs,  MD;  Location: [facility];  Service: Gynecology;  Laterality: Bilateral;    Current Outpatient Prescriptions  Medication Sig Dispense Refill  . docusate sodium (COLACE) 100 MG capsule Take one capsule by mouth daily as needed for constipation. 30 capsule 0  . ferrous sulfate 325 (65 FE) MG EC tablet Take one tablet twice a day with meals. 60 tablet 0  . ibuprofen (ADVIL,MOTRIN) 600 MG tablet Take 1 tablet (600 mg total) by mouth every 6 (six) hours as needed (mild pain). 30 tablet 0  . oxyCODONE-acetaminophen (PERCOCET/ROXICET) 5-325 MG per tablet Take 1-2 tablets by mouth every 4 (four) hours as needed for severe pain (moderate to severe pain (when tolerating fluids)). 30 tablet 0   No current facility-administered medications for this visit.     ALLERGIES: Review of patient's allergies indicates no known allergies.  Family History  Problem Relation Age of Onset  . Cancer Neg Hx   . Diabetes Neg Hx   . Heart disease Neg Hx   . Stroke Neg Hx     History   Social History  . Marital Status: Married    Spouse Name: N/A  . Number of Children: N/A  . Years of Education: N/A   Occupational History  . Not on file.   Social History Main Topics  . Smoking status: Never Smoker   .  Smokeless tobacco: Never Used  . Alcohol Use: No  . Drug Use: No  . Sexual Activity: Yes    Birth Control/ Protection: Surgical     Comment: Tubal/TAH/Bil Salpingectomy   Other Topics Concern  . Not on file   Social History Narrative    ROS:  Pertinent items are noted in HPI.  PHYSICAL EXAMINATION:    BP 122/84 mmHg  Pulse 56  Ht 5\' 3"  (1.6 m)  Wt 164 lb 3.2 oz (74.481 kg)  BMI 29.09 kg/m2  LMP 10/18/2008    General appearance: alert, cooperative and appears stated age Abdomen: Pfannensteil incision intact - soft, non-tender; bowel sounds normal; no masses,  no organomegaly  Pelvic: External genitalia:  no lesions              Urethra:  normal appearing urethra with no masses, tenderness or  lesions              Bartholins and Skenes: normal                 Vagina: normal appearing vagina with normal color and discharge, no lesions, suture line intact.               Cervix: absent             Bimanual Exam:  Uterus:  uterus absent              Adnexa: Not palpable.  Tender throughout with pelvic exam.              Rectovaginal:  Not done.   Chaperone was present for exam.  ASSESSMENT  Status post TAH/bilateral salpingectomy.  Normal post op swelling. No acute abdomen.  No signs of ileus.  PLAN  Reassurance given.  Encouraged pain medication use.  Follow up in 5 days.  Instructions and precautions given.    An After Visit Summary was printed and given to the patient.  __15____ minutes face to face time of which over 50% was spent in counseling.

## 2015-03-07 NOTE — Telephone Encounter (Signed)
Patient had surgery 03/03/15 and has some questions regarding swelling.

## 2015-03-12 ENCOUNTER — Ambulatory Visit (INDEPENDENT_AMBULATORY_CARE_PROVIDER_SITE_OTHER): Payer: 59 | Admitting: Obstetrics and Gynecology

## 2015-03-12 ENCOUNTER — Encounter: Payer: Self-pay | Admitting: Obstetrics and Gynecology

## 2015-03-12 VITALS — BP 102/76 | HR 64 | Temp 97.6°F | Resp 18 | Ht 63.0 in | Wt 161.0 lb

## 2015-03-12 DIAGNOSIS — Z90710 Acquired absence of both cervix and uterus: Secondary | ICD-10-CM

## 2015-03-12 NOTE — Progress Notes (Signed)
GYNECOLOGY  VISIT   HPI: 49 y.o.   Married  [place]  female   [phone] with Patient's last menstrual period was 10/18/2008.   here for   1 week Post Op - 03/03/15 -HYSTERECTOMY ABDOMINAL, BILATERAL SALPINGECTOMY, CYSTOSCOPY.   Swelling has decreased in abdomen.  Tolerating food and bowel funci normal.  Still taking pain medication but not every day.  No drainage or bleeding.   Annual exam was to be done the beginning of June 2016.   GYNECOLOGIC HISTORY: Patient's last menstrual period was 10/18/2008. Contraception: Hysterectomy  Menopausal hormone therapy: None Last mammogram: 03/19/14 BIRADS1:Neg Last pap smear: 03/01/14 Neg. HR HPV:Neg        OB History    Gravida Para Term Preterm AB TAB SAB Ectopic Multiple Living   2 2 2       2          Patient Active Problem List   Diagnosis Date Noted  . Status post total abdominal hysterectomy 03/03/2015  . Elevated blood pressure (not hypertension) 06/21/2014  . Fibroid     Past Medical History  Diagnosis Date  . H/O vitamin D deficiency   . Abnormal uterine bleeding   . Anemia 2013  . Fibroid 2011  . SVD (spontaneous vaginal delivery)     x 2  . High blood pressure     no current meds    Past Surgical History  Procedure Laterality Date  . Tubal ligation  1998  . Endometrial ablation  2010  . Bunionectomy Bilateral 2000, 1996  . Wisdom tooth extraction    . Abdominal hysterectomy N/A 03/03/2015    Procedure: HYSTERECTOMY ABDOMINAL;  Surgeon: Nunzio Cobbs, MD;  Location: [facility];  Service: Gynecology;  Laterality: N/A;  2 hours  . Bilateral salpingectomy Bilateral 03/03/2015    Procedure: BILATERAL SALPINGECTOMY;  Surgeon: Nunzio Cobbs, MD;  Location: [facility];  Service: Gynecology;  Laterality: Bilateral;    Current Outpatient Prescriptions  Medication Sig Dispense Refill  . docusate sodium (COLACE) 100 MG capsule Take one capsule by mouth daily as needed for constipation. 30 capsule 0  .  ferrous sulfate 325 (65 FE) MG EC tablet Take one tablet twice a day with meals. 60 tablet 0  . ibuprofen (ADVIL,MOTRIN) 600 MG tablet Take 1 tablet (600 mg total) by mouth every 6 (six) hours as needed (mild pain). 30 tablet 0  . oxyCODONE-acetaminophen (PERCOCET/ROXICET) 5-325 MG per tablet Take 1-2 tablets by mouth every 4 (four) hours as needed for severe pain (moderate to severe pain (when tolerating fluids)). 30 tablet 0   No current facility-administered medications for this visit.     ALLERGIES: Review of patient's allergies indicates no known allergies.  Family History  Problem Relation Age of Onset  . Cancer Neg Hx   . Diabetes Neg Hx   . Heart disease Neg Hx   . Stroke Neg Hx     History   Social History  . Marital Status: Married    Spouse Name: N/A  . Number of Children: N/A  . Years of Education: N/A   Occupational History  . Not on file.   Social History Main Topics  . Smoking status: Never Smoker   . Smokeless tobacco: Never Used  . Alcohol Use: No  . Drug Use: No  . Sexual Activity: Yes    Birth Control/ Protection: Surgical     Comment: Tubal/TAH/Bil Salpingectomy   Other Topics Concern  . Not on  file   Social History Narrative    ROS:  Pertinent items are noted in HPI.  PHYSICAL EXAMINATION:    BP 102/76 mmHg  Pulse 64  Temp(Src) 97.6 F (36.4 C) (Oral)  Resp 18  Ht 5\' 3"  (1.6 m)  Wt 161 lb (73.029 kg)  BMI 28.53 kg/m2  LMP 10/18/2008    General appearance: alert, cooperative and appears stated age   Abdomen: incision intact with steristrips, soft, non-tender; bowel sounds normal; no masses,  no organomegaly  Chaperone was present for exam.  ASSESSMENT  Doing well post op.  Feeling better.  PLAN  Counseled regarding  Increasing ambulation.  Advised against lifting over 10 pounds.  Follow up for 6 week post op visit.      An After Visit Summary was printed and given to the patient.  _10_____ minutes face to face time of  which over 50% was spent in counseling.

## 2015-03-15 NOTE — Discharge Summary (Signed)
Physician Discharge Summary  Patient ID: Angel Black MRN: [id number] DOB/AGE: [birth year] 49 y.o.  Admit date: 03/03/2015 Discharge date:  03/05/15  Admission Diagnoses:  Uterine fibroids.  Discharge Diagnoses:   Uterine fibroids, Status post total abdominal hysterectomy with bilateral salpingectomy, postoperative anemia.   Active Problems:   Status post total abdominal hysterectomy   Discharged Condition: good  Hospital Course:  The patient was admitted on 03/03/15 for a total abdominal hysterectomy with bilateral salpingectomy which were performed without complication while under general anesthesia.  The patient's post op course was uneventful.  She had a morphine PCA and Toradol for pain control initially, and this was converted over to Percocet and Motrin on post op day one when the patient began taking po well.  She ambulated independently and wore PAS and Ted hose for DVT prophylaxis while in bed.  Her foley catheter were removed on post op day one, and she voided good volumes. The patient's vital signs remained stable and she demonstrated no signs of infection during her hospitalization.  The patient's post op day one Hgb was 10.8, and her post op day two Hgb was 10.0.  She was tolerating the anemia well. She had very minimal vaginal bleeding, and her incision drained a mild amount of bloody drainage.  There was no sign of ecchymoses or induration.  She was found to be in good condition and ready for discharge on post op day two.  Consults: None  Significant Diagnostic Studies: labs:  See Hospital Course. Final pathology report showed benign fibroids, cervix, and fallopian tubes.   Treatments: surgery:  Total abdominal hysterectomy with bilateral salpingectomy  Discharge Exam: Blood pressure 117/70, pulse 63, temperature 98.4 F (36.9 C), temperature source Oral, resp. rate 18, height 5\' 3"  (1.6 m), weight 158 lb (71.668 kg), last menstrual period 10/18/2008, SpO2 100 %. General: alert  and cooperative Resp: clear to auscultation bilaterally Cardio: regular rate and rhythm, S1, S2 normal, no murmur, click, rub or gallop GI: soft, non-tender; bowel sounds normal; no masses, no organomegaly and incision: intact and Mild amount of mucousy blood staining.  Disposition: 01-Home or Self Care  Instructions and precautions were given verbally and in written form.    Medication List    TAKE these medications        docusate sodium 100 MG capsule  Commonly known as:  COLACE  Take one capsule by mouth daily as needed for constipation.     ferrous sulfate 325 (65 FE) MG EC tablet  Take one tablet twice a day with meals.     ibuprofen 600 MG tablet  Commonly known as:  ADVIL,MOTRIN  Take 1 tablet (600 mg total) by mouth every 6 (six) hours as needed (mild pain).     oxyCODONE-acetaminophen 5-325 MG per tablet  Commonly known as:  PERCOCET/ROXICET  Take 1-2 tablets by mouth every 4 (four) hours as needed for severe pain (moderate to severe pain (when tolerating fluids)).           Follow-up Information    Follow up with Arloa Koh, MD In 1 week.   Specialty:  Obstetrics and Gynecology   Contact information:   [address] [place] [address] [phone]       Signed: Arloa Koh 03/15/2015, 3:37 PM

## 2015-03-20 ENCOUNTER — Ambulatory Visit: Payer: Self-pay | Admitting: Certified Nurse Midwife

## 2015-03-24 ENCOUNTER — Other Ambulatory Visit: Payer: Self-pay

## 2015-03-24 DIAGNOSIS — Z1231 Encounter for screening mammogram for malignant neoplasm of breast: Secondary | ICD-10-CM

## 2015-03-26 ENCOUNTER — Ambulatory Visit
Admission: RE | Admit: 2015-03-26 | Discharge: 2015-03-26 | Disposition: A | Discharge status: Home / Self Care | Payer: 59 | Source: Ambulatory Visit

## 2015-03-26 DIAGNOSIS — Z1231 Encounter for screening mammogram for malignant neoplasm of breast: Secondary | ICD-10-CM

## 2015-03-27 ENCOUNTER — Other Ambulatory Visit: Payer: Self-pay | Admitting: Obstetrics and Gynecology

## 2015-03-27 DIAGNOSIS — R928 Other abnormal and inconclusive findings on diagnostic imaging of breast: Secondary | ICD-10-CM

## 2015-04-02 ENCOUNTER — Telehealth: Payer: Self-pay | Admitting: Obstetrics and Gynecology

## 2015-04-02 NOTE — Telephone Encounter (Signed)
Signed and back to your desk.  Thanks.

## 2015-04-02 NOTE — Telephone Encounter (Signed)
Signed order faxed to [facility] with cover sheet and confirmation. Spoke with [facility]. Order was received.  Routing to provider for final review. Patient agreeable to disposition. Will close encounter.

## 2015-04-02 NOTE — Telephone Encounter (Signed)
Make sure in [facility] hold, FYI.  Thanks.

## 2015-04-02 NOTE — Telephone Encounter (Signed)
Patient had abnormal mammogram on 03/27/2015 at [facility]. Has "possible asymmetry of left breast." Needs left breast diagnostic mammogram with ultrasound. Order to Dr.Miller's desk for review and sign before fax.

## 2015-04-02 NOTE — Telephone Encounter (Signed)
Pt scheduled at [facility] on 04/15/15 for mammogram. Rammy with [facility] requests order for additional views diagnostic. She also requests clarification if mammogram is screening or diagnostic.   Phone: [phone] Fax: [phone]

## 2015-04-03 NOTE — Telephone Encounter (Signed)
Patient is currently in mammogram hold. Thank you!

## 2015-04-16 ENCOUNTER — Other Ambulatory Visit: Payer: 59

## 2015-04-16 ENCOUNTER — Ambulatory Visit (INDEPENDENT_AMBULATORY_CARE_PROVIDER_SITE_OTHER): Payer: 59 | Admitting: Obstetrics and Gynecology

## 2015-04-16 ENCOUNTER — Encounter: Payer: Self-pay | Admitting: Obstetrics and Gynecology

## 2015-04-16 VITALS — BP 118/80 | HR 78 | Ht 63.0 in | Wt 158.4 lb

## 2015-04-16 DIAGNOSIS — Z90710 Acquired absence of both cervix and uterus: Secondary | ICD-10-CM

## 2015-04-16 DIAGNOSIS — R350 Frequency of micturition: Secondary | ICD-10-CM

## 2015-04-16 LAB — POCT URINALYSIS DIPSTICK
BILIRUBIN UA: NEGATIVE
Glucose, UA: NEGATIVE
Ketones, UA: NEGATIVE
Leukocytes, UA: NEGATIVE
NITRITE UA: NEGATIVE
PH UA: 5
PROTEIN UA: NEGATIVE
Urobilinogen, UA: NEGATIVE

## 2015-04-16 NOTE — Progress Notes (Signed)
GYNECOLOGY  VISIT   HPI: 49 y.o.   Married  Caucasian  female   G2P2002 with Patient's last menstrual period was 10/18/2008.   here for 6 week post op. Status post TAH/bilateral salpingectomy.   Energy level is good.  No bladder or bowel problems.  Some urinary frequency only.  No vaginal bleeding.  No pain medication use.   Has a diagnostic mammogram of left breast and ultrasound for tomorrow at [facility]. Possible asymmetry of left breast.   Urine - trace RBCs.  GYNECOLOGIC HISTORY: Patient's last menstrual period was 10/18/2008. Contraception: Hysterectomy Menopausal hormone therapy: none Last mammogram: 03/27/15 bi-rads 0 additional imaging needed Last pap smear: 03/01/14 wnl neg hr hpv        OB History    Gravida Para Term Preterm AB TAB SAB Ectopic Multiple Living   2 2 2       2          Patient Active Problem List   Diagnosis Date Noted  . Status post total abdominal hysterectomy 03/03/2015  . Elevated blood pressure (not hypertension) 06/21/2014  . Fibroid     Past Medical History  Diagnosis Date  . H/O vitamin D deficiency   . Abnormal uterine bleeding   . Anemia 2013  . Fibroid 2011  . SVD (spontaneous vaginal delivery)     x 2  . High blood pressure     no current meds    Past Surgical History  Procedure Laterality Date  . Tubal ligation  1998  . Endometrial ablation  2010  . Bunionectomy Bilateral 2000, 1996  . Wisdom tooth extraction    . Abdominal hysterectomy N/A 03/03/2015    Procedure: HYSTERECTOMY ABDOMINAL;  Surgeon: Nunzio Cobbs, MD;  Location: [facility];  Service: Gynecology;  Laterality: N/A;  2 hours  . Bilateral salpingectomy Bilateral 03/03/2015    Procedure: BILATERAL SALPINGECTOMY;  Surgeon: Nunzio Cobbs, MD;  Location: [facility];  Service: Gynecology;  Laterality: Bilateral;    Current Outpatient Prescriptions  Medication Sig Dispense Refill  . docusate sodium (COLACE) 100 MG capsule Take one capsule by  mouth daily as needed for constipation. 30 capsule 0  . ferrous sulfate 325 (65 FE) MG EC tablet Take one tablet twice a day with meals. 60 tablet 0  . ibuprofen (ADVIL,MOTRIN) 600 MG tablet Take 1 tablet (600 mg total) by mouth every 6 (six) hours as needed (mild pain). 30 tablet 0  . oxyCODONE-acetaminophen (PERCOCET/ROXICET) 5-325 MG per tablet Take 1-2 tablets by mouth every 4 (four) hours as needed for severe pain (moderate to severe pain (when tolerating fluids)). 30 tablet 0   No current facility-administered medications for this visit.     ALLERGIES: Review of patient's allergies indicates no known allergies.  Family History  Problem Relation Age of Onset  . Cancer Neg Hx   . Diabetes Neg Hx   . Heart disease Neg Hx   . Stroke Neg Hx     History   Social History  . Marital Status: Married    Spouse Name: N/A  . Number of Children: N/A  . Years of Education: N/A   Occupational History  . Not on file.   Social History Main Topics  . Smoking status: Never Smoker   . Smokeless tobacco: Never Used  . Alcohol Use: No  . Drug Use: No  . Sexual Activity: Yes    Birth Control/ Protection: Surgical     Comment: Tubal/TAH/Bil Salpingectomy  Other Topics Concern  . Not on file   Social History Narrative    ROS:  Pertinent items are noted in HPI.  PHYSICAL EXAMINATION:    LMP 10/18/2008    General appearance: alert, cooperative and appears stated age Abdomen: Pfannenstiel incision intact, soft, non-tender; bowel sounds normal; no masses,  no organomegaly  Pelvic: External genitalia:  no lesions              Urethra:  normal appearing urethra with no masses, tenderness or lesions              Bartholins and Skenes: normal                 Vagina: normal appearing vagina with normal color and discharge, no lesions              Cervix: absent and vaginal cuff intact.             Bimanual Exam:  Uterus:  uterus absent              Adnexa: no mass, fullness,  tenderness            Chaperone was present for exam.  ASSESSMENT  Status post total abdominal hysterectomy with bilateral salpingectomy. Urinary frequency.  Trace microscopic hematuria.  Incomplete breast imaging on routine mammogram.   PLAN  Urine micro and culture.  Return to all normal activity.  Complete breast imaging.  Annual exam scheduled.  An After Visit Summary was printed and given to the patient.  __15____ minutes face to face time of which over 50% was spent in counseling.

## 2015-04-17 LAB — URINALYSIS, MICROSCOPIC ONLY
BACTERIA UA: NONE SEEN
CASTS: NONE SEEN
Crystals: NONE SEEN

## 2015-04-17 LAB — URINE CULTURE: Colony Count: 25000

## 2015-04-18 ENCOUNTER — Telehealth: Payer: Self-pay | Admitting: Obstetrics and Gynecology

## 2015-04-18 NOTE — Telephone Encounter (Signed)
Patient calling for results.

## 2015-04-18 NOTE — Telephone Encounter (Signed)
Spoke with patient. Advised of results as seen below from Dr.Silva. Patient is agreeable and verbalizes understanding.  Routing to provider for final review. Patient agreeable to disposition. Will close encounter.     

## 2015-04-18 NOTE — Telephone Encounter (Signed)
Patient Result Comments     Entered by Nunzio Cobbs, MD at 04/18/2015 4:55 AM    Hello Ms. Skaggs,   Your final urine culture is back, and is negative for infection. No dominant organism grew, which is what defines a positive urine culture.   Let me know if you need anything further.   Have a good 4th of July!   Josefa Half, MD

## 2015-08-14 ENCOUNTER — Ambulatory Visit: Payer: 59 | Admitting: Obstetrics and Gynecology

## 2016-08-06 IMAGING — US US PELVIS COMPLETE
1 series · 13 of 25 positions shown · non-contrast
Comparison: None

CLINICAL DATA: Lower pelvic pain, generally postprandially, for the
past 2 months. , history of fibroids, intermittent spotting.

EXAM:
TRANSABDOMINAL AND TRANSVAGINAL ULTRASOUND OF PELVIS
TECHNIQUE: Both transabdominal and transvaginal ultrasound examinations of the
pelvis were performed. Transabdominal technique was performed for
global imaging of the pelvis including uterus, ovaries, adnexal
regions, and pelvic cul-de-sac. It was necessary to proceed with
endovaginal exam following the transabdominal exam to visualize the
endometrium and adnexal structures.

[Series 1: us pelvis complete · 0.24mm/px · 13 of 38 slices shown]
[im 1/38]
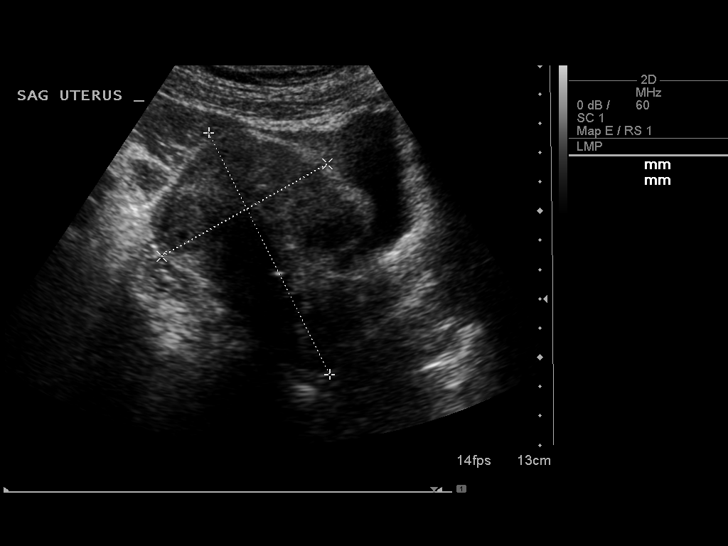
[im 4/38]
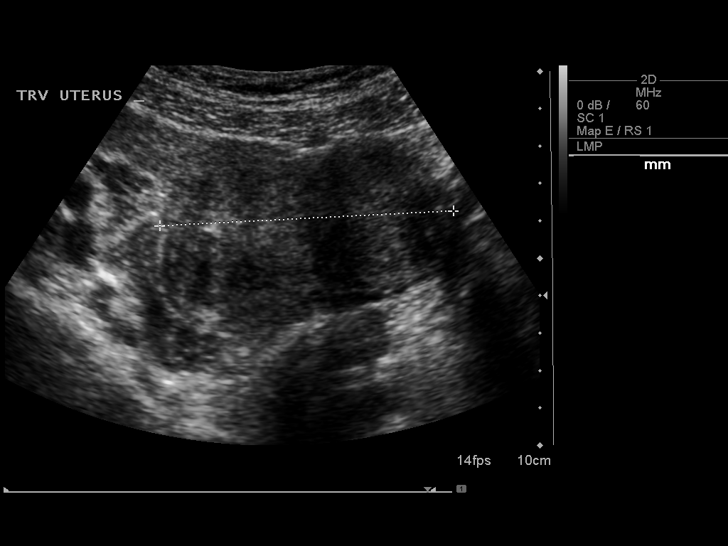
[im 7/38]
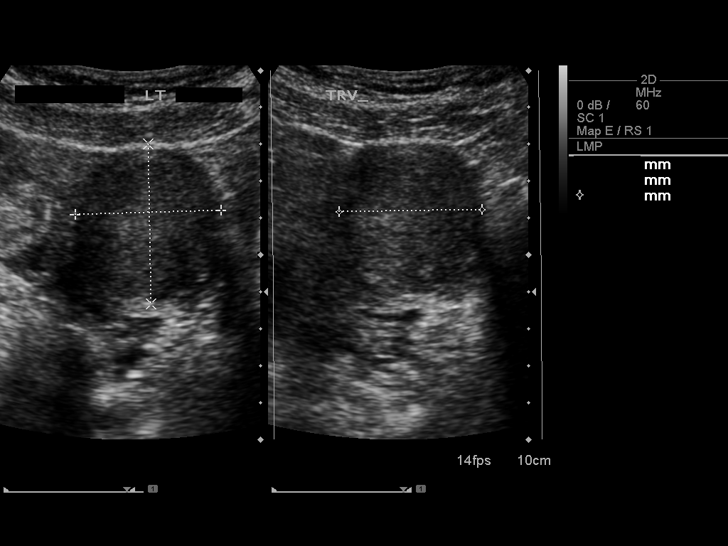
[im 10/38]
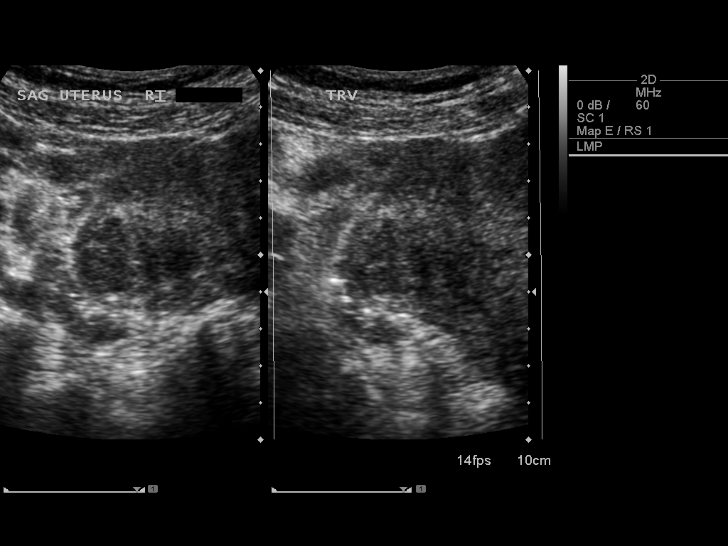
[im 13/38]
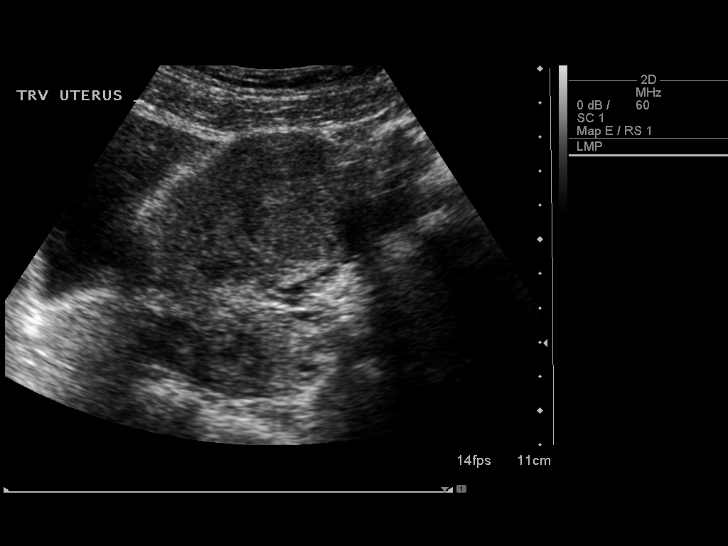
[im 16/38]
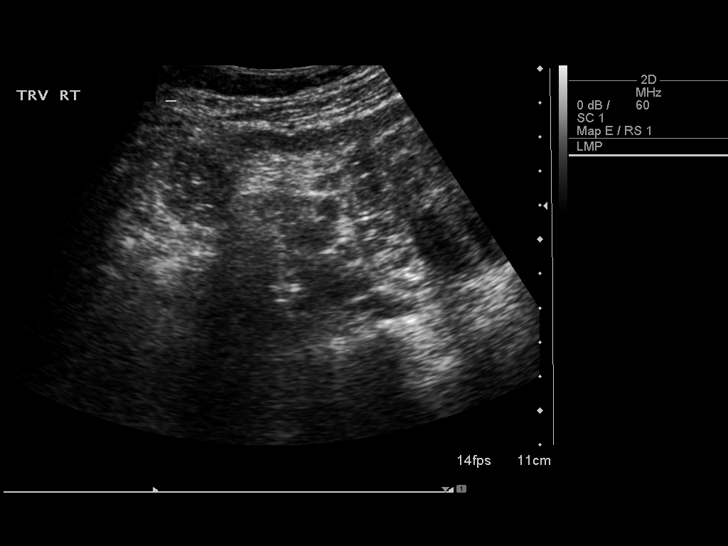
[im 19/38]
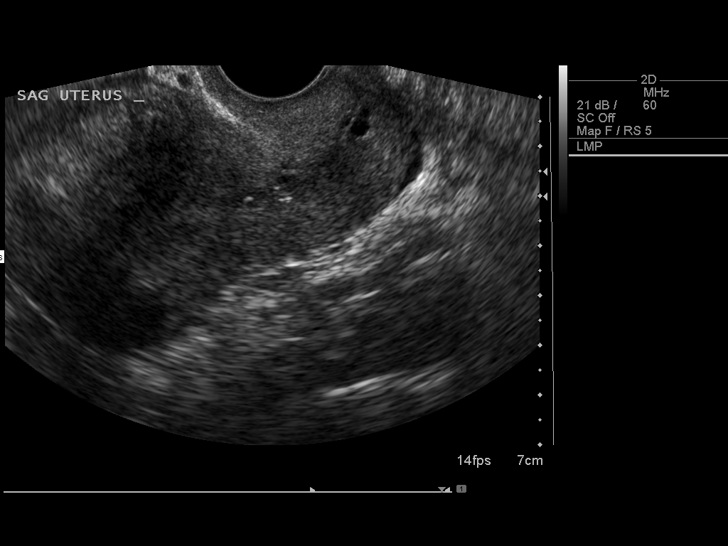
[im 22/38]
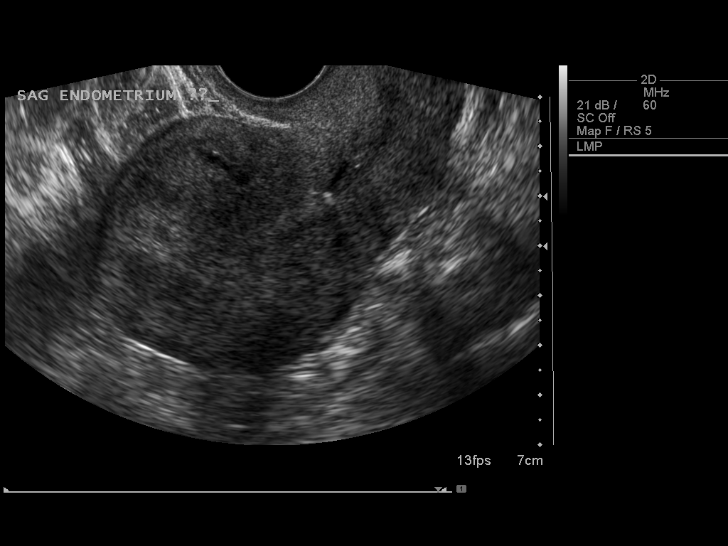
[im 25/38]
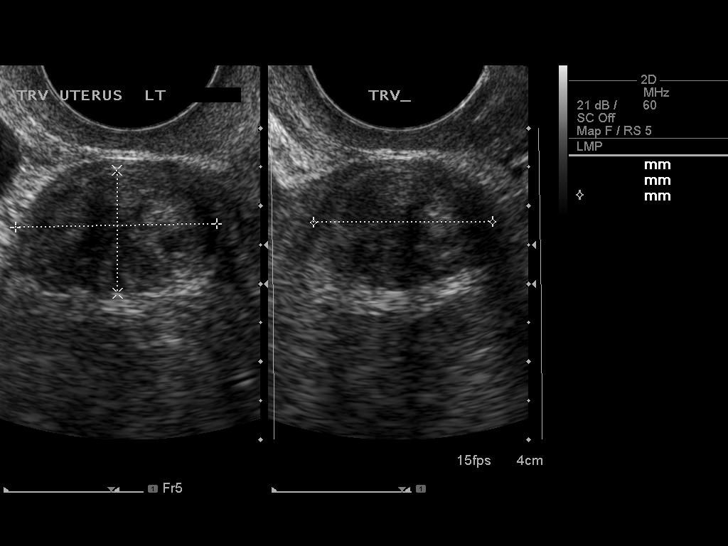
[im 28/38]
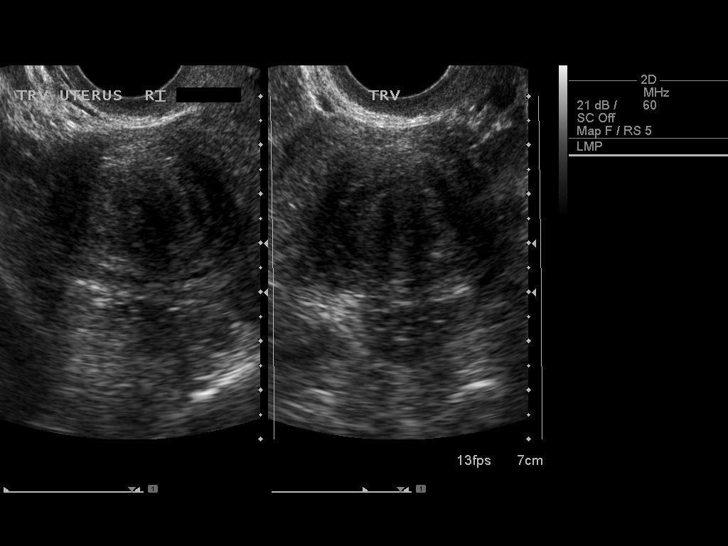
[im 31/38]
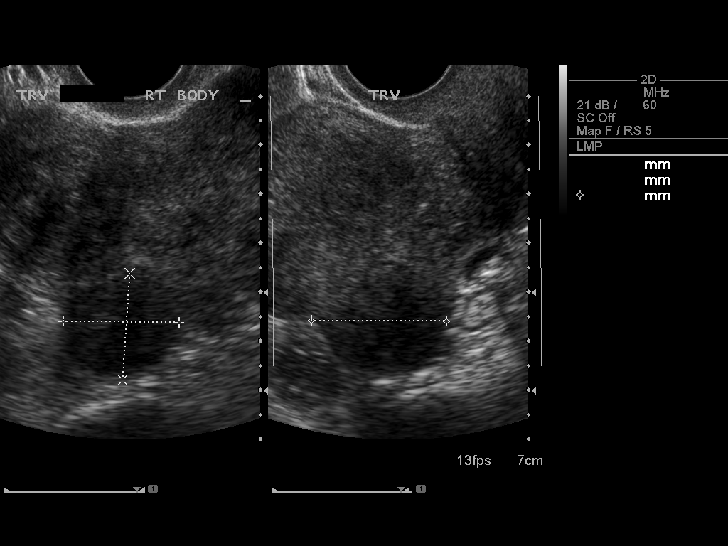
[im 34/38]
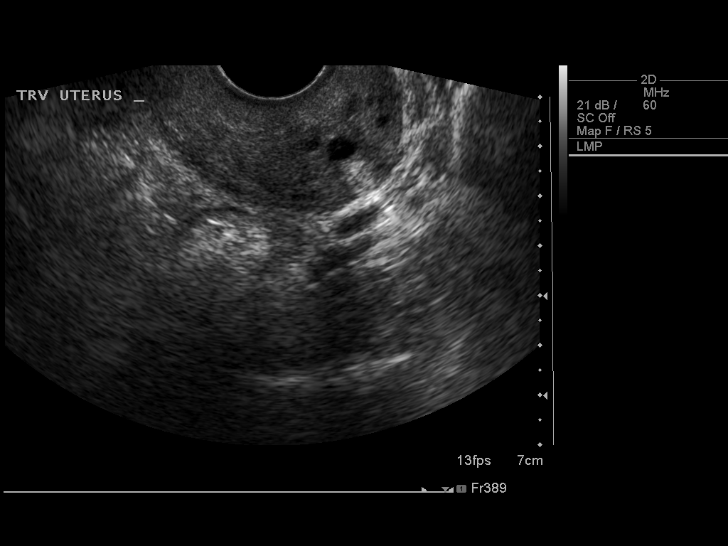
[im 38/38]
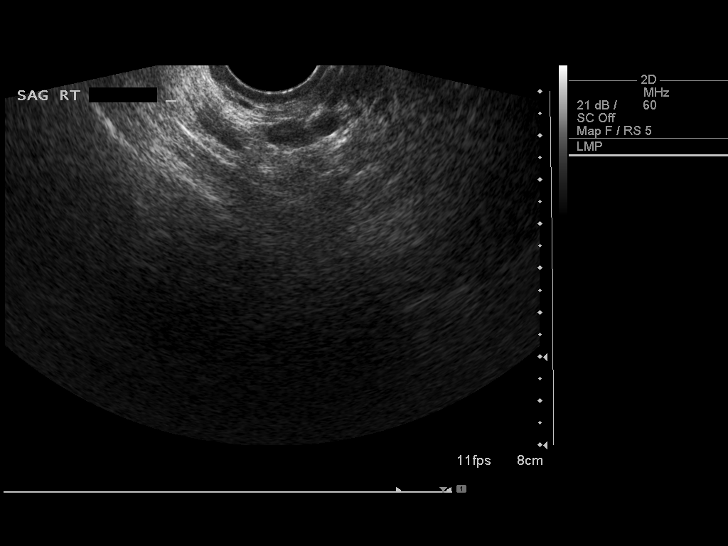

[13 of 25 positions shown; findings below may reference images not displayed]

FINDINGS: Uterus

Measurements: 9.2 x 6.5 x 7.9 cm. There are multiple fibroids. On
the right the largest measures 3.9 cm in greatest dimension. On the
left the largest measures 2.9 cm.

Endometrium

Thickness: 5.5 mm.  No focal abnormality visualized.

Right ovary

The right ovary could not be visualized

Left ovary

The left ovary could not be visualized

Other findings

No free fluid.
IMPRESSION: 1. There are multiple uterine fibroids with the largest measuring
3.9 cm.
2. The endometrial stripe is normal at 5.5 mm. In the setting of
post-menopausal bleeding, endometrial sampling is indicated to
exclude carcinoma. If results are benign, sonohysterogram should be
considered for focal lesion work-up. (Ref: Radiological Reasoning:
Algorithmic Workup of Abnormal Vaginal Bleeding with Endovaginal
Sonography and Sonohysterography. AJR 1226; 191:S68-73)
3. The ovaries could not be demonstrated. No discrete adnexal masses
were demonstrated either.

## 2017-07-05 ENCOUNTER — Ambulatory Visit (INDEPENDENT_AMBULATORY_CARE_PROVIDER_SITE_OTHER): Payer: Managed Care, Other (non HMO) | Admitting: Internal Medicine

## 2017-07-05 ENCOUNTER — Telehealth: Payer: Self-pay | Admitting: Internal Medicine

## 2017-07-05 ENCOUNTER — Encounter: Payer: Self-pay | Admitting: Internal Medicine

## 2017-07-05 VITALS — BP 118/78 | HR 56 | Temp 97.9°F | Ht 62.75 in | Wt 151.0 lb

## 2017-07-05 DIAGNOSIS — Z0000 Encounter for general adult medical examination without abnormal findings: Secondary | ICD-10-CM

## 2017-07-05 LAB — COMPREHENSIVE METABOLIC PANEL
ALBUMIN: 4.2 g/dL (ref 3.5–5.2)
ALK PHOS: 76 U/L (ref 39–117)
ALT: 15 U/L (ref 0–35)
AST: 19 U/L (ref 0–37)
BUN: 20 mg/dL (ref 6–23)
CHLORIDE: 104 meq/L (ref 96–112)
CO2: 27 meq/L (ref 19–32)
Calcium: 9.2 mg/dL (ref 8.4–10.5)
Creatinine, Ser: 0.65 mg/dL (ref 0.40–1.20)
GFR: 123.21 mL/min (ref >60.00)
Glucose, Bld: 78 mg/dL (ref 70–99)
Potassium: 3.7 mEq/L (ref 3.5–5.1)
SODIUM: 139 meq/L (ref 135–145)
Total Bilirubin: 1.2 mg/dL (ref 0.2–1.2)
Total Protein: 7.1 g/dL (ref 6.0–8.3)

## 2017-07-05 LAB — LIPID PANEL
CHOL/HDL RATIO: 2
Cholesterol: 161 mg/dL (ref 0–200)
HDL: 90.3 mg/dL (ref >39.00)
LDL Cholesterol: 62 mg/dL (ref 0–99)
NonHDL: 70.29
TRIGLYCERIDES: 39 mg/dL (ref 0.0–149.0)
VLDL: 7.8 mg/dL (ref 0.0–40.0)

## 2017-07-05 LAB — CBC
HCT: 37.7 % (ref 36.0–46.0)
Hemoglobin: 11.8 g/dL — ABNORMAL LOW (ref 12.0–15.0)
MCHC: 31.2 g/dL (ref 30.0–36.0)
MCV: 75 fl — AB (ref 78.0–100.0)
Platelets: 159 10*3/uL (ref 150.0–400.0)
RBC: 5.03 Mil/uL (ref 3.87–5.11)
RDW: 14.9 % (ref 11.5–15.5)
WBC: 7.9 10*3/uL (ref 4.0–10.5)

## 2017-07-05 LAB — HEMOGLOBIN A1C: Hgb A1c MFr Bld: 5.7 % (ref 4.6–6.5)

## 2017-07-05 LAB — FOLLICLE STIMULATING HORMONE: FSH: 75.7 m[IU]/mL

## 2017-07-05 LAB — LUTEINIZING HORMONE: LH: 44.92 m[IU]/mL

## 2017-07-05 NOTE — Telephone Encounter (Signed)
Patient called with the fax number to send her lab results.  Fax number [phone] ATTN: Frederich Cha.

## 2017-07-05 NOTE — Patient Instructions (Signed)

## 2017-07-05 NOTE — Progress Notes (Signed)
Subjective:    Patient ID: Angel Black, female    DOB: [birth year], 51 y.o.   MRN: [id number]  HPI  Pt presents to the clinic today for her annual exam.  Flu: never Tetanus: 2017 Pap Smear: 08/2015, partial hysterectomy Mammogram: 06/2016 Colon Screening: 08/2016 Vision Screening: annually Dentist: biannually  Diet: She doe eat some meat. She consumes some fruits and veggies .She tries to avoid fried foods. She drinks mostly water, some juice. Exercise: None since the move  Review of Systems      Past Medical History:  Diagnosis Date  . Abnormal uterine bleeding   . Anemia 2013  . Fibroid 2011  . H/O vitamin D deficiency   . High blood pressure    no current meds  . SVD (spontaneous vaginal delivery)    x 2    Current Outpatient Prescriptions  Medication Sig Dispense Refill  . ferrous sulfate 325 (65 FE) MG EC tablet Take one tablet twice a day with meals. 60 tablet 0   No current facility-administered medications for this visit.     No Known Allergies  Family History  Problem Relation Age of Onset  . Cancer Neg Hx   . Diabetes Neg Hx   . Heart disease Neg Hx   . Stroke Neg Hx     Social History   Social History  . Marital status: Married    Spouse name: N/A  . Number of children: N/A  . Years of education: N/A   Occupational History  . Not on file.   Social History Main Topics  . Smoking status: Never Smoker  . Smokeless tobacco: Never Used  . Alcohol use No  . Drug use: No  . Sexual activity: Yes    Birth control/ protection: Surgical     Comment: Tubal/TAH/Bil Salpingectomy   Other Topics Concern  . Not on file   Social History Narrative  . No narrative on file     Constitutional: Denies fever, malaise, fatigue, headache or abrupt weight changes.  HEENT: Denies eye pain, eye redness, ear pain, ringing in the ears, wax buildup, runny nose, nasal congestion, bloody nose, or sore throat. Respiratory: Denies difficulty breathing,  shortness of breath, cough or sputum production.   Cardiovascular: Denies chest pain, chest tightness, palpitations or swelling in the hands or feet.  Gastrointestinal: Pt reports intermittent constipation. Denies abdominal pain, bloating, diarrhea or blood in the stool.  GU: Denies urgency, frequency, pain with urination, burning sensation, blood in urine, odor or discharge. Musculoskeletal: Denies decrease in range of motion, difficulty with gait, muscle pain or joint pain and swelling.  Skin: Denies redness, rashes, lesions or ulcercations.  Neurological: Denies dizziness, difficulty with memory, difficulty with speech or problems with balance and coordination.  Psych: Denies anxiety, depression, SI/HI.  No other specific complaints in a complete review of systems (except as listed in HPI above).  Objective:   Physical Exam    BP 118/78   Pulse (!) 56   Temp 97.9 F (36.6 C) (Oral)   Ht 5' 2.75" (1.594 m)   Wt 151 lb (68.5 kg)   LMP 03/18/2014   SpO2 99%   BMI 26.96 kg/m  Wt Readings from Last 3 Encounters:  07/05/17 151 lb (68.5 kg)  04/16/15 158 lb 6.4 oz (71.8 kg)  03/12/15 161 lb (73 kg)    General: Appears her stated age, well developed, well nourished in NAD. Skin: Warm, dry and intact.  HEENT: Head: normal shape and size; Eyes:  sclera white, no icterus, conjunctiva pink, PERRLA and EOMs intact; Ears: Tm's gray and intact, normal light reflex; Throat/Mouth: Teeth present, mucosa pink and moist, no exudate, lesions or ulcerations noted.  Neck:  Neck supple, trachea midline. No masses, lumps or thyromegaly present.  Cardiovascular: Normal rate and rhythm. S1,S2 noted.  No murmur, rubs or gallops noted. No JVD or BLE edema.  Pulmonary/Chest: Normal effort and positive vesicular breath sounds. No respiratory distress. No wheezes, rales or ronchi noted.  Abdomen: Soft and nontender. Normal bowel sounds. No distention or masses noted. Liver, spleen and kidneys non  palpable. Pelvic: Normal female anatomy. Adnexa non palpable.  Musculoskeletal: Strength 5/5 BUE/BLE. No difficulty with gait.  Neurological: Alert and oriented. Cranial nerves II-XII grossly intact. Coordination normal.  Psychiatric: Mood and affect normal. Behavior is normal. Judgment and thought content normal.   BMET    Component Value Date/Time   NA 137 03/04/2015 0625   K 4.4 03/04/2015 0625   CL 103 03/04/2015 0625   CO2 28 03/04/2015 0625   GLUCOSE 104 (H) 03/04/2015 0625   BUN 7 03/04/2015 0625   CREATININE 0.66 03/04/2015 0625   CALCIUM 8.3 (L) 03/04/2015 0625   GFRNONAA >60 03/04/2015 0625   GFRAA >60 03/04/2015 0625    Lipid Panel  No results found for: CHOL, TRIG, HDL, CHOLHDL, VLDL, LDLCALC  CBC    Component Value Date/Time   WBC 8.6 03/05/2015 0500   RBC 4.24 03/05/2015 0500   HGB 10.0 (L) 03/05/2015 0500   HCT 31.1 (L) 03/05/2015 0500   PLT 122 (L) 03/05/2015 0500   MCV 73.3 (L) 03/05/2015 0500   MCH 23.6 (L) 03/05/2015 0500   MCHC 32.2 03/05/2015 0500   RDW 14.8 03/05/2015 0500   LYMPHSABS 1.9 03/05/2015 0500   MONOABS 1.0 03/05/2015 0500   EOSABS 0.1 03/05/2015 0500   BASOSABS 0.0 03/05/2015 0500    Hgb A1C No results found for: HGBA1C        Assessment & Plan:   Preventative Health Maintenance:  Encouraged her to get a flu shot in the fall Tetanus UTD Pelvic exam today, she does not need paps Mammogram UTD Colon Screening UTD Encouraged her to consume a balanced diet and exercise regimen Advised her to see an eye doctor and dentist annually Will check CBC, CMET, Lipid, A1C, FSH, LH, and urinalysis (per pt request for screening)  RTC in 1 year, sooner if needed Webb Silversmith, NP

## 2017-07-05 NOTE — Telephone Encounter (Signed)
Form faxed after receiving med rec release

## 2017-09-20 ENCOUNTER — Ambulatory Visit (INDEPENDENT_AMBULATORY_CARE_PROVIDER_SITE_OTHER): Payer: Managed Care, Other (non HMO) | Admitting: Internal Medicine

## 2017-09-20 ENCOUNTER — Encounter: Payer: Self-pay | Admitting: Internal Medicine

## 2017-09-20 VITALS — BP 118/76 | HR 72 | Temp 98.0°F | Wt 153.0 lb

## 2017-09-20 DIAGNOSIS — N760 Acute vaginitis: Secondary | ICD-10-CM | POA: Diagnosis not present

## 2017-09-20 DIAGNOSIS — N898 Other specified noninflammatory disorders of vagina: Secondary | ICD-10-CM

## 2017-09-20 DIAGNOSIS — R102 Pelvic and perineal pain: Secondary | ICD-10-CM

## 2017-09-20 DIAGNOSIS — B9689 Other specified bacterial agents as the cause of diseases classified elsewhere: Secondary | ICD-10-CM

## 2017-09-20 LAB — POC URINALSYSI DIPSTICK (AUTOMATED)
Bilirubin, UA: NEGATIVE
Blood, UA: NEGATIVE
Glucose, UA: NEGATIVE
KETONES UA: NEGATIVE
Nitrite, UA: NEGATIVE
SPEC GRAV UA: 1.02 (ref 1.010–1.025)
Urobilinogen, UA: 0.2 E.U./dL
pH, UA: 7.5 (ref 5.0–8.0)

## 2017-09-20 MED ORDER — METRONIDAZOLE 500 MG PO TABS: 500.0000 mg | ORAL_TABLET | Freq: Two times a day (BID) | ORAL | 0 refills | Status: DC

## 2017-09-20 NOTE — Addendum Note (Signed)
Addended by: Lurlean Nanny on: 09/20/2017 04:57 PM   Modules accepted: Orders

## 2017-09-20 NOTE — Patient Instructions (Signed)

## 2017-09-20 NOTE — Addendum Note (Signed)
Addended by: Mady Haagensen on: 09/20/2017 05:20 PM   Modules accepted: Orders

## 2017-09-20 NOTE — Progress Notes (Signed)
Subjective:    Patient ID: Angel Black, female    DOB: [birth year], 51 y.o.   MRN: [id number]  HPI  Pt presents to the clinic today with c/o abdominal pressure and vaginal discharge. This started 3 weeks ago. The abdominal pressure is worse on the right than the left. The vaginal discharge is thin and white. She denies odor or abnormal bleeding. She denies urinary symptoms. She has not tried anything OTC for her symptoms. She is sexually active.  Review of Systems      Past Medical History:  Diagnosis Date  . Anemia 2013  . High blood pressure    no current meds    Current Outpatient Medications  Medication Sig Dispense Refill  . metroNIDAZOLE (FLAGYL) 500 MG tablet Take 1 tablet (500 mg total) by mouth 2 (two) times daily. 10 tablet 0   No current facility-administered medications for this visit.     No Known Allergies  Family History  Problem Relation Age of Onset  . Cancer Neg Hx   . Diabetes Neg Hx   . Heart disease Neg Hx   . Stroke Neg Hx     Social History   Socioeconomic History  . Marital status: Married    Spouse name: Not on file  . Number of children: Not on file  . Years of education: Not on file  . Highest education level: Not on file  Social Needs  . Financial resource strain: Not on file  . Food insecurity - worry: Not on file  . Food insecurity - inability: Not on file  . Transportation needs - medical: Not on file  . Transportation needs - non-medical: Not on file  Occupational History  . Not on file  Tobacco Use  . Smoking status: Never Smoker  . Smokeless tobacco: Never Used  Substance and Sexual Activity  . Alcohol use: No    Alcohol/week: 0.0 oz  . Drug use: No  . Sexual activity: Yes    Birth control/protection: Surgical    Comment: Tubal/TAH/Bil Salpingectomy  Other Topics Concern  . Not on file  Social History Narrative  . Not on file     Constitutional: Denies fever, malaise, fatigue, headache or abrupt weight changes.    Gastrointestinal: Pt reports pelvic pressure. Denies bloating, constipation, diarrhea or blood in the stool.  GU: Pt reports discharge. Denies urgency, frequency, pain with urination, burning sensation, blood in urine, odor.   No other specific complaints in a complete review of systems (except as listed in HPI above).  Objective:   Physical Exam   BP 118/76   Pulse 72   Temp 98 F (36.7 C) (Oral)   Wt 153 lb (69.4 kg)   LMP 03/18/2014   BMI 27.32 kg/m  Wt Readings from Last 3 Encounters:  09/20/17 153 lb (69.4 kg)  07/05/17 151 lb (68.5 kg)  04/16/15 158 lb 6.4 oz (71.8 kg)    General: Appears her stated age, well developed, well nourished in NAD. Abdomen: Soft and diffusely tender in the suprapubic region. Hyperactive bowel sounds. No distention or masses noted.  Pelvic: Self swabbed.  BMET    Component Value Date/Time   NA 139 07/05/2017 0908   K 3.7 07/05/2017 0908   CL 104 07/05/2017 0908   CO2 27 07/05/2017 0908   GLUCOSE 78 07/05/2017 0908   BUN 20 07/05/2017 0908   CREATININE 0.65 07/05/2017 0908   CALCIUM 9.2 07/05/2017 0908   GFRNONAA >60 03/04/2015 0625   GFRAA >60  03/04/2015 0625    Lipid Panel     Component Value Date/Time   CHOL 161 07/05/2017 0908   TRIG 39.0 07/05/2017 0908   HDL 90.30 07/05/2017 0908   CHOLHDL 2 07/05/2017 0908   VLDL 7.8 07/05/2017 0908   LDLCALC 62 07/05/2017 0908    CBC    Component Value Date/Time   WBC 7.9 07/05/2017 0908   RBC 5.03 07/05/2017 0908   HGB 11.8 (L) 07/05/2017 0908   HCT 37.7 07/05/2017 0908   PLT 159.0 07/05/2017 0908   MCV 75.0 (L) 07/05/2017 0908   MCH 23.6 (L) 03/05/2015 0500   MCHC 31.2 07/05/2017 0908   RDW 14.9 07/05/2017 0908   LYMPHSABS 1.9 03/05/2015 0500   MONOABS 1.0 03/05/2015 0500   EOSABS 0.1 03/05/2015 0500   BASOSABS 0.0 03/05/2015 0500    Hgb A1C Lab Results  Component Value Date   HGBA1C 5.7 07/05/2017           Assessment & Plan:   Pelvic Pressure, Vaginal  Discharge secondary to BV:  Urinalysis: 3+ leuks Will not send urine culture Urine gen probe obtained Wet prep: + clue cells eRx for Flagyl 500 mg BID x 5 days- advised her not to drink alcohol with this medication  Will follow up after labs, return precautions discussed Webb Silversmith, NP

## 2017-09-21 LAB — C. TRACHOMATIS/N. GONORRHOEAE RNA
C. trachomatis RNA, TMA: NOT DETECTED
N. gonorrhoeae RNA, TMA: NOT DETECTED

## 2017-10-05 ENCOUNTER — Telehealth: Payer: Self-pay | Admitting: Internal Medicine

## 2017-10-05 NOTE — Telephone Encounter (Signed)
The course she took should have cleared up her symptoms. If she is still having discharge, she should follow up here or with GYN.

## 2017-10-05 NOTE — Telephone Encounter (Signed)
I spoke to pt and she stated she could not come in tomorrow but will try to come in on Fri and I have scheduled her for 4:15

## 2017-10-05 NOTE — Telephone Encounter (Signed)
Copied from [place] [id number]. Topic: Quick Communication - Rx Refill/Question >> Oct 05, 2017  8:29 AM Arletha Grippe wrote: Has the patient contacted their pharmacy? No.   (Agent: If no, request that the patient contact the pharmacy for the refill.)   Preferred Pharmacy (with phone number or street name): pt has questions about flagyl. She is still having discharge.  She is requesting to have another rx called in. She uses [facility] [address] [place] cb# [phone]   Agent: Please be advised that RX refills may take up to 3 business days. We ask that you follow-up with your pharmacy.

## 2017-10-07 ENCOUNTER — Telehealth: Payer: Self-pay | Admitting: Obstetrics and Gynecology

## 2017-10-07 ENCOUNTER — Ambulatory Visit (INDEPENDENT_AMBULATORY_CARE_PROVIDER_SITE_OTHER): Payer: Managed Care, Other (non HMO) | Admitting: Internal Medicine

## 2017-10-07 ENCOUNTER — Encounter: Payer: Self-pay | Admitting: Internal Medicine

## 2017-10-07 ENCOUNTER — Telehealth: Payer: Self-pay | Admitting: Internal Medicine

## 2017-10-07 VITALS — BP 118/78 | HR 71 | Temp 97.8°F | Wt 152.0 lb

## 2017-10-07 DIAGNOSIS — L729 Follicular cyst of the skin and subcutaneous tissue, unspecified: Secondary | ICD-10-CM

## 2017-10-07 DIAGNOSIS — R102 Pelvic and perineal pain: Secondary | ICD-10-CM | POA: Diagnosis not present

## 2017-10-07 DIAGNOSIS — B9689 Other specified bacterial agents as the cause of diseases classified elsewhere: Secondary | ICD-10-CM | POA: Diagnosis not present

## 2017-10-07 DIAGNOSIS — N898 Other specified noninflammatory disorders of vagina: Secondary | ICD-10-CM | POA: Diagnosis not present

## 2017-10-07 DIAGNOSIS — N760 Acute vaginitis: Secondary | ICD-10-CM

## 2017-10-07 NOTE — Telephone Encounter (Signed)
Routing to Dr. Rosann Auerbach.   Cc: Dr. Sabra Heck

## 2017-10-07 NOTE — Telephone Encounter (Signed)
Spoke with patient. Reports intermittent bilateral pelvic pain started 3 weeks ago with vaginal d/c. Describes pain as "soreness", can not rate on pain scale. Noticed "lump" on left lower side of her back last night.   Seen by PCP, treated for BV with flagyl, completed 5 days ago. Patient reports d/c has improved, describes as white and dry, no odor.   Hx of abdominal hysterectomy, bilateral salpingectomy. Denies bleeding, urinary complaints, fever/chills, N/V.   Patient states she is scheduled for f/u with pcp today at 4:15pm. Advised patient to keep appt as scheduled with pcp, OV scheduled with Dr. Sabra Heck on 10/12/17 at 11:30pm. ER precautions reviewed.   Advised will review with covering provider and return call with any additional recommendations. Patient verbalizes understanding and is agreeable.   Melvia Heaps, CNM -any additional recommendations?   Cc: Dr. Sabra Heck

## 2017-10-07 NOTE — Telephone Encounter (Signed)
Pt is aware labs were released via mychart... Pt does not want to use mychart and I told pt to deactivate the mychart so results and communication will not be through [employer]... Pt expressed understanding and will keep her appt for f/u on vag discharge today

## 2017-10-07 NOTE — Telephone Encounter (Signed)
Copied from [place] #[id number]. Topic: Quick Communication - See Telephone Encounter >> Oct 07, 2017 10:54 AM Bea Graff, NT wrote: Reason for CRM: Pt would like a nurse to call her about her lab results, and why she has an appt for today? Please call pt.

## 2017-10-07 NOTE — Telephone Encounter (Signed)
Patient called to report she is experiencing pain on both sides of her abdomin and also having slight discharge. She'd like an appointment today, if possible.  Last seen: 04/16/15

## 2017-10-07 NOTE — Patient Instructions (Signed)
Excision of Skin Lesions, Care After Refer to this sheet in the next few weeks. These instructions provide you with information about caring for yourself after your procedure. Your health care provider may also give you more specific instructions. Your treatment has been planned according to current medical practices, but problems sometimes occur. Call your health care provider if you have any problems or questions after your procedure. What can I expect after the procedure? After your procedure, it is common to have pain or discomfort at the excision site. Follow these instructions at home:  Take over-the-counter and prescription medicines only as told by your health care provider.  Follow instructions from your health care provider about: ? How to take care of your excision site. You should keep the site clean, dry, and protected for at least 48 hours. ? When and how you should change your bandage (dressing). ? When you should remove your dressing. ? Removing whatever was used to close your excision site.  Check the excision area every day for signs of infection. Watch for: ? Redness, swelling, or pain. ? Fluid, blood, or pus.  For bleeding, apply gentle but firm pressure to the area using a folded towel for 20 minutes.  Avoid high-impact exercise and activities until the stitches (sutures) are removed or the area heals.  Follow instructions from your health care provider about how to minimize scarring. Avoid sun exposure until the area has healed. Scarring should lessen over time.  Keep all follow-up visits as told by your health care provider. This is important. Contact a health care provider if:  You have a fever.  You have redness, swelling, or pain at the excision site.  You have fluid, blood, or pus coming from the excision site.  You have ongoing bleeding at the excision site.  You have pain that does not improve in 2-3 days after your procedure.  You notice skin  irregularities or changes in sensation. This information is not intended to replace advice given to you by your health care provider. Make sure you discuss any questions you have with your health care provider. Document Released: 02/18/2015 Document Revised: 03/11/2016 Document Reviewed: 11/20/2014 Elsevier Interactive Patient Education  2018 [employer]  

## 2017-10-07 NOTE — Telephone Encounter (Signed)
Left message to call Tiearra Colwell at [phone].  

## 2017-10-07 NOTE — Addendum Note (Signed)
Addended by: Lurlean Nanny on: 10/07/2017 04:50 PM   Modules accepted: Orders

## 2017-10-07 NOTE — Progress Notes (Signed)
Subjective:    Patient ID: Angel Black, female    DOB: [birth year], 51 y.o.   MRN: [id number]  HPI  Pt presents to the clinic today for follow up vaginal discharge and pelvic pressure. She was seen 12/4 for the same. She was diagnosed with BV and treated with 5 days of Flagyl. She has not noticed any improvmeent since that time. She reports continued pressure and discharge. The discharge is thin and white. There is no odor. She denies abnormal bleeding. She denies urinary symptoms. She has not tried anything OTC for her symptoms. Her urine G/C was negative at her last visit.  She also reports a lump under her skin on the left side of her abdomen. She is not sure how long it has been there. It is tender to touch. She has not tried anything OTC for her symptoms.  Review of Systems      Past Medical History:  Diagnosis Date  . Anemia 2013  . High blood pressure    no current meds    Current Outpatient Medications  Medication Sig Dispense Refill  . metroNIDAZOLE (FLAGYL) 500 MG tablet Take 1 tablet (500 mg total) by mouth 2 (two) times daily. 10 tablet 0   No current facility-administered medications for this visit.     No Known Allergies  Family History  Problem Relation Age of Onset  . Cancer Neg Hx   . Diabetes Neg Hx   . Heart disease Neg Hx   . Stroke Neg Hx     Social History   Socioeconomic History  . Marital status: Married    Spouse name: Not on file  . Number of children: Not on file  . Years of education: Not on file  . Highest education level: Not on file  Social Needs  . Financial resource strain: Not on file  . Food insecurity - worry: Not on file  . Food insecurity - inability: Not on file  . Transportation needs - medical: Not on file  . Transportation needs - non-medical: Not on file  Occupational History  . Not on file  Tobacco Use  . Smoking status: Never Smoker  . Smokeless tobacco: Never Used  Substance and Sexual Activity  . Alcohol use: No     Alcohol/week: 0.0 oz  . Drug use: No  . Sexual activity: Yes    Birth control/protection: Surgical    Comment: Tubal/TAH/Bil Salpingectomy  Other Topics Concern  . Not on file  Social History Narrative  . Not on file     Constitutional: Denies fever, malaise, fatigue, headache or abrupt weight changes.  Gastrointestinal: Denies abdominal pain, bloating, constipation, diarrhea or blood in the stool.  GU: Pt reports pelvic pressure and vaginal discharge. Denies urgency, frequency, pain with urination, burning sensation, blood in urine, odor. Skin: Pt reports lump under skin. Denies redness, rashes or ulcerations.  No other specific complaints in a complete review of systems (except as listed in HPI above).  Objective:   Physical Exam    BP 118/78   Pulse 71   Temp 97.8 F (36.6 C) (Oral)   Wt 152 lb (68.9 kg)   LMP 03/18/2014   SpO2 98%   BMI 27.14 kg/m  Wt Readings from Last 3 Encounters:  10/07/17 152 lb (68.9 kg)  09/20/17 153 lb (69.4 kg)  07/05/17 151 lb (68.5 kg)    General: Appears her stated age, well developed, well nourished in NAD. Abdomen: Soft and mildly tender in the LLQ.  Normal bowel sounds. No distention or masses noted.  Pelvic: Normal female anatomy. Thin white discharge noted, no odor. No cervix to visualize. Skin: Round, smooth, mobile cyst noted of left flank.  BMET    Component Value Date/Time   NA 139 07/05/2017 0908   K 3.7 07/05/2017 0908   CL 104 07/05/2017 0908   CO2 27 07/05/2017 0908   GLUCOSE 78 07/05/2017 0908   BUN 20 07/05/2017 0908   CREATININE 0.65 07/05/2017 0908   CALCIUM 9.2 07/05/2017 0908   GFRNONAA >60 03/04/2015 0625   GFRAA >60 03/04/2015 0625    Lipid Panel     Component Value Date/Time   CHOL 161 07/05/2017 0908   TRIG 39.0 07/05/2017 0908   HDL 90.30 07/05/2017 0908   CHOLHDL 2 07/05/2017 0908   VLDL 7.8 07/05/2017 0908   LDLCALC 62 07/05/2017 0908    CBC    Component Value Date/Time   WBC 7.9  07/05/2017 0908   RBC 5.03 07/05/2017 0908   HGB 11.8 (L) 07/05/2017 0908   HCT 37.7 07/05/2017 0908   PLT 159.0 07/05/2017 0908   MCV 75.0 (L) 07/05/2017 0908   MCH 23.6 (L) 03/05/2015 0500   MCHC 31.2 07/05/2017 0908   RDW 14.9 07/05/2017 0908   LYMPHSABS 1.9 03/05/2015 0500   MONOABS 1.0 03/05/2015 0500   EOSABS 0.1 03/05/2015 0500   BASOSABS 0.0 03/05/2015 0500    Hgb A1C Lab Results  Component Value Date   HGBA1C 5.7 07/05/2017          Assessment & Plan:   Pelvic Pressure and Vaginal Discharge:  Will send off wet prep Will hold off on treatment until wet prep is back  Cyst of Skin:  Advised her this was benign No further intervention needed at this time Will monitor  Will follow up after labs are back, return precautions discussed Webb Silversmith, NP

## 2017-10-07 NOTE — Telephone Encounter (Signed)
Agree with recommendations. She needs to be seen in the ER over the weekend if her symptoms are severe. Please close encounter.

## 2017-10-08 LAB — WET PREP BY MOLECULAR PROBE
CANDIDA SPECIES: NOT DETECTED
MICRO NUMBER:: 81439757
SPECIMEN QUALITY: ADEQUATE
TRICHOMONAS VAG: NOT DETECTED

## 2017-10-12 ENCOUNTER — Ambulatory Visit: Payer: Managed Care, Other (non HMO) | Admitting: Obstetrics and Gynecology

## 2017-10-12 ENCOUNTER — Encounter: Payer: Self-pay | Admitting: Obstetrics and Gynecology

## 2017-10-12 ENCOUNTER — Telehealth: Payer: Self-pay | Admitting: Internal Medicine

## 2017-10-12 ENCOUNTER — Other Ambulatory Visit: Payer: Self-pay

## 2017-10-12 VITALS — BP 122/82 | HR 64 | Resp 14 | Wt 149.0 lb

## 2017-10-12 DIAGNOSIS — R1030 Lower abdominal pain, unspecified: Secondary | ICD-10-CM

## 2017-10-12 MED ORDER — METRONIDAZOLE 500 MG PO TABS: 500.0000 mg | ORAL_TABLET | Freq: Two times a day (BID) | ORAL | 0 refills | Status: AC

## 2017-10-12 NOTE — Telephone Encounter (Signed)
Pt. Called and given lab results. States she would prefer pills over vaginal suppository. Please call into [facility] on [address]

## 2017-10-12 NOTE — Patient Instructions (Signed)
You can try gas-x or beano, to see if that helps Ibuprofen or tylenol as needed

## 2017-10-12 NOTE — Addendum Note (Signed)
Addended by: Lurlean Nanny on: 10/12/2017 01:21 PM   Modules accepted: Orders

## 2017-10-12 NOTE — Progress Notes (Signed)
GYNECOLOGY  VISIT   HPI: 51 y.o.   Married  [place]  female   [phone] with Patient's last menstrual period was 03/18/2014.   here c/o left side pain. Recently treated for BV by PCP 10-07-17 for 5 days. She was having some d/c a few days ago, currently better. No itching burning, irritation or odor.  She has a h/o TAH/BS. She c/o a one month h/o intermittent BLQ abdominal pain, more on the left. The pain is lasting about a minute, no coming every day. The pain has been cramping or sharp. Up to a 7.5/10 in severity. Nothing brings the pain on. Normal BM every day. No urinary c/o. Sexually active, no pain.   GYNECOLOGIC HISTORY: Patient's last menstrual period was 03/18/2014. Contraception:hysterectomy  Menopausal hormone therapy: none         OB History    Gravida Para Term Preterm AB Living   2 2 2     2    SAB TAB Ectopic Multiple Live Births                     There are no active problems to display for this patient.   Past Medical History:  Diagnosis Date  . Anemia 2013  . High blood pressure    no current meds    Past Surgical History:  Procedure Laterality Date  . ABDOMINAL HYSTERECTOMY N/A 03/03/2015   Procedure: HYSTERECTOMY ABDOMINAL;  Surgeon: Nunzio Cobbs, MD;  Location: [facility];  Service: Gynecology;  Laterality: N/A;  2 hours  . BILATERAL SALPINGECTOMY Bilateral 03/03/2015   Procedure: BILATERAL SALPINGECTOMY;  Surgeon: Nunzio Cobbs, MD;  Location: [facility];  Service: Gynecology;  Laterality: Bilateral;  . BUNIONECTOMY Bilateral 2000, 1996  . ENDOMETRIAL ABLATION  2010  . TUBAL LIGATION  1998  . WISDOM TOOTH EXTRACTION      No current outpatient medications on file.   No current facility-administered medications for this visit.      ALLERGIES: Patient has no known allergies.  Family History  Problem Relation Age of Onset  . Cancer Neg Hx   . Diabetes Neg Hx   . Heart disease Neg Hx   . Stroke Neg Hx     Social History    Socioeconomic History  . Marital status: Married    Spouse name: Not on file  . Number of children: Not on file  . Years of education: Not on file  . Highest education level: Not on file  Social Needs  . Financial resource strain: Not on file  . Food insecurity - worry: Not on file  . Food insecurity - inability: Not on file  . Transportation needs - medical: Not on file  . Transportation needs - non-medical: Not on file  Occupational History  . Not on file  Tobacco Use  . Smoking status: Never Smoker  . Smokeless tobacco: Never Used  Substance and Sexual Activity  . Alcohol use: No    Alcohol/week: 0.0 oz  . Drug use: No  . Sexual activity: Yes    Birth control/protection: Surgical    Comment: Tubal/TAH/Bil Salpingectomy  Other Topics Concern  . Not on file  Social History Narrative  . Not on file    Review of Systems  Constitutional: Negative.   HENT: Negative.   Eyes: Negative.   Respiratory: Negative.   Gastrointestinal: Negative.   Genitourinary:       Left side pain  Musculoskeletal: Negative.  Skin: Negative.   Neurological: Negative.   Endo/Heme/Allergies: Negative.   Psychiatric/Behavioral: Negative.     PHYSICAL EXAMINATION:    BP 122/82 (BP Location: Right Arm, Patient Position: Sitting, Cuff Size: Normal)   Pulse 64   Resp 14   Wt 149 lb (67.6 kg)   LMP 03/18/2014   BMI 26.60 kg/m     General appearance: alert, cooperative and appears stated age Abdomen: soft, non-tender; moderately distended, no masses,  no organomegaly  Pelvic: External genitalia:  no lesions              Urethra:  normal appearing urethra with no masses, tenderness or lesions              Bartholins and Skenes: normal                 Vagina: normal appearing vagina with normal color and discharge, no lesions              Cervix: absent              Bimanual Exam:  Uterus:  uterus absent              Adnexa: no mass, fullness, tenderness              Rectovaginal:  Yes.  .  Confirms.              Anus:  normal sphincter tone, no lesions  Pelvic floor: not tender  Chaperone was present for exam.  ASSESSMENT One month h/o intermittent lower abdominal pain. Normal pelvic exam, some abdominal distention on exam    PLAN Can try gas-x or beano Ibuprofen or tylenol for the pain F/U with primary if pain persists If primary doesn't find a cause she can return for a GYN ultrasound.    An After Visit Summary was printed and given to the patient.  ~15 minutes face to face time of which over 50% was spent in counseling.

## 2017-10-13 NOTE — Telephone Encounter (Signed)
This was done yesterday by MYD, CMA via verbal order
